# Patient Record
Sex: Male | Born: 1970
Health system: Southern US, Community
[De-identification: ages and names within clinical notes are randomized; demographics above are authoritative.]

## PROBLEM LIST (undated history)

## (undated) DIAGNOSIS — I10 Essential (primary) hypertension: Secondary | ICD-10-CM

## (undated) DIAGNOSIS — E119 Type 2 diabetes mellitus without complications: Secondary | ICD-10-CM

## (undated) DIAGNOSIS — E785 Hyperlipidemia, unspecified: Secondary | ICD-10-CM

## (undated) HISTORY — DX: Hyperlipidemia, unspecified: E78.5

---

## 2007-01-17 ENCOUNTER — Emergency Department (HOSPITAL_COMMUNITY): Admission: EM | Admit: 2007-01-17 | Discharge: 2007-01-17 | Payer: Self-pay | Admitting: Emergency Medicine

## 2015-05-14 ENCOUNTER — Ambulatory Visit (INDEPENDENT_AMBULATORY_CARE_PROVIDER_SITE_OTHER): Payer: Self-pay | Admitting: Family Medicine

## 2015-05-14 VITALS — BP 140/90 | HR 67 | Temp 98.5°F | Resp 16 | Ht 71.0 in | Wt 189.0 lb

## 2015-05-14 DIAGNOSIS — Z021 Encounter for pre-employment examination: Secondary | ICD-10-CM

## 2015-05-14 DIAGNOSIS — Z024 Encounter for examination for driving license: Secondary | ICD-10-CM

## 2015-05-14 NOTE — Progress Notes (Signed)
Airline pilot Medical Examination   Dominic Hill is a 44 y.o. male who presents today for a commercial driver fitness determination physical exam. The patient reports no problems. The following portions of the patient's history were reviewed and updated as appropriate: allergies, current medications, past family history, past medical history, past social history, past surgical history and problem list. Review of Systems A comprehensive review of systems was negative.   Objective:    Vision:  Visual Acuity Screening   Right eye Left eye Both eyes  Without correction: With correction:     Comments: Peripheral Vision: Right eye 85 degrees. Left eye 85 degrees.    Applicant can recognize and distinguish among traffic control signals and devices showing standard red, green, and amber colors.     Monocular Vision?: No   Hearing:   Hearing Screening Comments: Whisper Test Passed at 10 ft      BP 148/90 mmHg  Pulse 67  Temp(Src) 98.5 F (36.9 C) (Oral)  Resp 16  Ht  (1.803 m)  Wt 189 lb (85.73 kg)  BMI 26.37 kg/m2  SpO2 99%  General Appearance:    Alert, cooperative, no distress, appears stated age  Head:    Normocephalic, without obvious abnormality, atraumatic  Eyes:    PERRL, conjunctiva/corneas clear, EOM's intact, fundi    benign, both eyes       Ears:    Normal TM's and external ear canals, both ears  Nose:   Nares normal, septum midline, mucosa normal, no drainage    or sinus tenderness  Throat:   Lips, mucosa, and tongue normal; teeth and gums normal  Neck:   Supple, symmetrical, trachea midline, no adenopathy;       thyroid:  No enlargement/tenderness/nodules; no carotid   bruit or JVD  Back:     Symmetric, no curvature, ROM normal, no CVA tenderness  Lungs:     Clear to auscultation bilaterally, respirations unlabored  Chest wall:    No tenderness or deformity  Heart:    Regular rate and rhythm, S1 and S2 normal, no murmur,  rub   or gallop  Abdomen:     Soft, non-tender, bowel sounds active all four quadrants,    no masses, no organomegaly  Genitalia:  No inguinal hernias  Rectal:    Extremities:   Extremities normal, atraumatic, no cyanosis or edema  Pulses:   2+ and symmetric all extremities  Skin:   Skin color, texture, turgor normal, no rashes or lesions  Lymph nodes:   Cervical, supraclavicular, and axillary nodes normal  Neurologic:   CNII-XII intact. Normal strength, sensation and reflexes      throughout    Labs: No results found for: SPECGRAV, PROTEINUR, BILIRUBINUR, GLUCOSEU    Assessment:    Healthy male exam.  Meets standards in 23 CFR 391.41;  qualifies for 2 year certificate.    Plan:    Medical examiners certificate completed and printed. Return as needed.   Elevated BP - borderline but technically ok - pt admits stressed out in office and not a very healthy diet. Reviewed dec salt and increase water. Check bp outside office every 1-2 mos and see physician to start on BP med prior to next DOT exam if BP continues to by 140/90 or higher.

## 2017-03-28 ENCOUNTER — Encounter: Payer: Self-pay | Admitting: Physician Assistant

## 2017-03-28 ENCOUNTER — Ambulatory Visit (INDEPENDENT_AMBULATORY_CARE_PROVIDER_SITE_OTHER): Payer: Self-pay | Admitting: Physician Assistant

## 2017-03-28 VITALS — BP 143/85 | HR 70 | Temp 98.9°F | Resp 17 | Ht 71.0 in | Wt 201.4 lb

## 2017-03-28 DIAGNOSIS — Z024 Encounter for examination for driving license: Secondary | ICD-10-CM

## 2017-03-28 DIAGNOSIS — R030 Elevated blood-pressure reading, without diagnosis of hypertension: Secondary | ICD-10-CM | POA: Insufficient documentation

## 2017-03-28 NOTE — Patient Instructions (Addendum)
Your blood pressure is elevated. Please establish with a primary care provider, here if you like, for additional evaluation and possible treatment. Your DOT card is good for ONE year due to the blood pressure elevation. It MUST be less than 140/90 next time to qualify.    IF you received an x-ray today, you will receive an invoice from [facility]. Please contact [facility] at [phone] with questions or concerns regarding your invoice.   IF you received labwork today, you will receive an invoice from [facility]. Please contact [facility] at [phone] with questions or concerns regarding your invoice.   Our billing staff will not be able to assist you with questions regarding bills from these companies.  You will be contacted with the lab results as soon as they are available. The fastest way to get your results is to activate your My Chart account. Instructions are located on the last page of this paperwork. If you have not heard from us regarding the results in 2 weeks, please contact this office.

## 2017-03-28 NOTE — Progress Notes (Signed)
This patient presents for DOT examination for fitness for duty.   Medical History:  1. Head/Brain Injuries, disorders or illnesses no  2. Seizures, epilepsy no  3. Eye disorders or impaired vision (except corrective lenses) no  4. Ear disorders, loss of hearing or balance no  5. Heart disease or heart attack, other cardiovascular condition no  6. Heart surgery (valve replacement/bypass, angioplasty, pacemaker/defribrillator) no  7. High blood pressure no  8. High cholesterol no  9. Chronic cough, shortness of breath or other breathing problems no  10. Lung disease (emphysema, asthma or chronic bronchitis) no  11. Kidney disease, dialysis no  12. Digestive problems  no  13. Diabetes or elevated blood sugar no                      If yes to #13, Insulin use no  14. Nervious or psychiatric disorders, e.g., severe depression no  15. Fainting or syncope no  16. Dizziness, headaches, numbness, tingling or memory loss no  17. Unexplained weight loss no  18. Stroke, TIA or paralysis no  19. Missing or impaired hand, arm, foot, leg, finger, toe no  20. Spinal injury or disease no  21. Bone, muscles or nerve problems no  22. Blood clots or bleeding bleeding disorders no  23. Cancer no  24. Chronic infection or other chronic diseases no  25. Sleep disorders, pauses in breathing while asleep, daytime sleepiness, loud snoring no  26. Have you ever had a sleep test? no  27.  Have you ever spent a night in the hospital? no  28. Have you ever had a broken bone? no  29. Have you or or do you use tobacco products? no  30. Regular, frequent alcohol use no  31. Illegal substance use within the past 2 years no  32.  Have you ever failed a drug test or been dependent on an illegal substance? no   Current Medications: Prior to Admission medications   Not on File    Medical Examiner's Comments on Health History:  Chart reviewed. At his last DOT exam (05/14/2015), BP was 148/90, and he  was advised to establish with a primary care provider for evaluation and anticipated treatment for mild HTN. He received a 2 year card. This is his first visit back in this health system.  TESTING:   Visual Acuity Screening   Right eye Left eye Both eyes  Without correction: 20/25 20/25 20/20   With correction:       Monocular Vision: No.  Hearing Aid used for test: No. Hearing Aid required to to meet standard: No.  BP (!) 182/99   Pulse 70   Temp 98.9 F (37.2 C) (Oral)   Resp 17   Ht 5\' 11"  (1.803 m)   Wt 201 lb 6 oz (91.3 kg)   SpO2 97%   BMI 28.09 kg/m  Pulse rate is regular     PHYSICAL EXAMINATION:  General Appearance Not markedly obese. No tremor, signs of alcoholism, problem drinking or drug abuse.   Skin Warm, dry and intact. Scars (bilateral knees, consistent with traumatic injuries, well healed), Tattoos (none)  Eyes Pupils are equal, round and reactive to light and accommodation, extraocular movements are intact. No exophthalmos, no nystagmus. Evidence of cataracts, retinopathy, macular degeneration, aphakia, glaucoma may need referral to a specialist.  Ears Normal external ears. External canal without occlusion. No scarring of the TM. No perforation of the TM.  Mouth and Throat  Clear and moist. No irremedial deformities likely to interfere with breathing or swallowing.  Heart No murmurs, extra sounds, evidence of cardiomegaly. No pacemaker. No implantable defibrillator.  Lungs and Chest (excluding breasts) Normal chest expansion, respiratory rate, breath sounds. No cyanosis.  Abdomen and Viscera No liver enlargement. No splenic enlargement. No masses, bruits, hernias or significant abdominal wall weakness.  Genitourinary  No inguinal or femoral hernia.  Spine and other musculoskeletal No tenderness, no limitation of motion, no deformities. No evidence of previous surgery.  Extremities No loss or impairment of leg, foot, toe, arm, hand, finger. No perceptible limp,  deformities, atrophy, weakness, paralysis, clubbing, edema, hypotonia. Patient has sufficient grasp and prehension to maintain steering wheel grip. Patient has sufficient mobility and strength in the lower limbs to operate pedals properly.  Neurologic Normal equilibrium, coordination, speech pattern. No paresthesia, asymmetry of deep tendon reflexes, sensory or positional abnormalities. No abnormality of patellar or Babinski's reflexes.  Gait Not antalgic or ataxic  Vascular Normal pulses. No carotid or arterial bruits. No varicose veins.    Meets standards, but periodic monitoring required due to: elevated blood pressure  Driver qualified only for: 1 year   Wearing corrective lenses: no Wearing hearing aid: no Accompanied by a N/A waiver/exemption Skill performance Evaluation (SPE) Certificate: no Driving within an exempt intracity zone: no Qualified by operation of 49 CFR 391.64: no  Certification expires 03/28/2017

## 2018-03-23 ENCOUNTER — Ambulatory Visit: Payer: BLUE CROSS/BLUE SHIELD | Admitting: Physician Assistant

## 2018-03-23 ENCOUNTER — Other Ambulatory Visit: Payer: Self-pay

## 2018-03-23 ENCOUNTER — Encounter: Payer: Self-pay | Admitting: Physician Assistant

## 2018-03-23 VITALS — BP 150/84 | HR 75 | Temp 99.3°F | Resp 18 | Ht 71.0 in | Wt 205.0 lb

## 2018-03-23 DIAGNOSIS — I10 Essential (primary) hypertension: Secondary | ICD-10-CM | POA: Diagnosis not present

## 2018-03-23 DIAGNOSIS — E119 Type 2 diabetes mellitus without complications: Secondary | ICD-10-CM

## 2018-03-23 DIAGNOSIS — R9431 Abnormal electrocardiogram [ECG] [EKG]: Secondary | ICD-10-CM | POA: Diagnosis not present

## 2018-03-23 MED ORDER — AMLODIPINE BESYLATE 10 MG PO TABS: 10.0000 mg | ORAL_TABLET | Freq: Every day | ORAL | 3 refills | Status: DC

## 2018-03-23 NOTE — Progress Notes (Signed)
Dominic Hill  MRN: [id number] DOB: 1970-11-06  PCP: Mancel Bale, PA-C  Chief Complaint  Patient presents with  . Hypertension    Subjective:  Pt presents to clinic for evaluation of his BP.  He had a DOT and he was only given a 3 month card due to elevated BP. He does not check his BP at home.  Last DOT card was only for a year due to elevated BP.  History is obtained by patient.  Review of Systems  Constitutional: Negative for chills and fever.  Eyes: Negative for visual disturbance.  Respiratory: Negative for cough and shortness of breath.   Cardiovascular: Negative for chest pain, palpitations and leg swelling.  Neurological: Negative for dizziness, light-headedness and headaches.    Patient Active Problem List   Diagnosis Date Noted  . Elevated blood pressure reading 03/28/2017    No current outpatient medications on file prior to visit.   No current facility-administered medications on file prior to visit.     No Known Allergies  History reviewed. No pertinent past medical history. Social History   Social History Narrative   Lives with wife,   From [place] - came to [place] in 2000   Social History   Tobacco Use  . Smoking status: Never Smoker  . Smokeless tobacco: Never Used  Substance Use Topics  . Alcohol use: No    Alcohol/week: 0.0 oz  . Drug use: No   family history includes Hypertension in his sister.     Objective:  BP (!) 150/84   Pulse 75   Temp 99.3 F (37.4 C) (Oral)   Resp 18   Ht '5\' 11"'  (1.803 m)   Wt 205 lb (93 kg)   SpO2 97%   BMI 28.59 kg/m  Body mass index is 28.59 kg/m.  Wt Readings from Last 3 Encounters:  03/23/18 205 lb (93 kg)  03/28/17 201 lb 6 oz (91.3 kg)  05/14/15 189 lb (85.7 kg)    Physical Exam  Constitutional: He is oriented to person, place, and time. He appears well-developed and well-nourished.  HENT:  Head: Normocephalic and atraumatic.  Right Ear: External ear normal.  Left Ear: External ear  normal.  Eyes: Conjunctivae are normal.  Neck: Normal range of motion.  Cardiovascular: Normal rate, regular rhythm, normal heart sounds and intact distal pulses.  Pulmonary/Chest: Effort normal and breath sounds normal. He has no wheezes.  Musculoskeletal:       Right lower leg: He exhibits no edema.       Left lower leg: He exhibits no edema.  Neurological: He is alert and oriented to person, place, and time.  Skin: Skin is warm and dry.  Psychiatric: Judgment normal.  Vitals reviewed.  Rhythm: sinus rhythm at a rate of 64. Findings: RBBB Last EKG: none  Changes from last EKG: n/a I have personally reviewed the EKG tracing and agree with the computerized printout.  Assessment and Plan :  Essential hypertension - Plan: EKG 12-Lead, CMP14+EGFR, TSH, amLODipine (NORVASC) 10 MG tablet, Ambulatory referral to Cardiology  Abnormal EKG - Plan: Ambulatory referral to Cardiology -  check labs start medications recheck in 4 weeks. Refer to cards due to abnl ekg - likely will need echo  D/w [place]  Patient verbalized to me that they understand the following: diagnosis, what is being done for them, what to expect and what should be done at home.  Their questions have been answered.  See after visit summary for patient specific instructions.  Windell Hummingbird PA-C  [facility] 03/23/2018 5:18 PM  Please note: Portions of this report may have been transcribed using dragon voice recognition software. Every effort was made to ensure accuracy; however, inadvertent computerized transcription errors may be present.

## 2018-03-23 NOTE — Patient Instructions (Addendum)
Start norvasc to treat your high blood pressure  IF you received an x-ray today, you will receive an invoice from [facility]. Please contact [facility] at [phone] with questions or concerns regarding your invoice.   IF you received labwork today, you will receive an invoice from [facility]. Please contact [facility] at [phone] with questions or concerns regarding your invoice.   Our billing staff will not be able to assist you with questions regarding bills from these companies.  You will be contacted with the lab results as soon as they are available. The fastest way to get your results is to activate your My Chart account. Instructions are located on the last page of this paperwork. If you have not heard from us regarding the results in 2 weeks, please contact this office.     DASH Eating Plan DASH stands for "Dietary Approaches to Stop Hypertension." The DASH eating plan is a healthy eating plan that has been shown to reduce high blood pressure (hypertension). It may also reduce your risk for type 2 diabetes, heart disease, and stroke. The DASH eating plan may also help with weight loss. What are tips for following this plan? General guidelines  Avoid eating more than 2,300 mg (milligrams) of salt (sodium) a day. If you have hypertension, you may need to reduce your sodium intake to 1,500 mg a day.  Limit alcohol intake to no more than 1 drink a day for nonpregnant women and 2 drinks a day for men. One drink equals 12 oz of beer, 5 oz of wine, or 1 oz of hard liquor.  Work with your health care provider to maintain a healthy body weight or to lose weight. Ask what an ideal weight is for you.  Get at least 30 minutes of exercise that causes your heart to beat faster (aerobic exercise) most days of the week. Activities may include walking, swimming, or biking.  Work with your health care provider or diet and nutrition specialist (dietitian) to adjust your  eating plan to your individual calorie needs. Reading food labels  Check food labels for the amount of sodium per serving. Choose foods with less than 5 percent of the Daily Value of sodium. Generally, foods with less than 300 mg of sodium per serving fit into this eating plan.  To find whole grains, look for the word "whole" as the first word in the ingredient list. Shopping  Buy products labeled as "low-sodium" or "no salt added."  Buy fresh foods. Avoid canned foods and premade or frozen meals. Cooking  Avoid adding salt when cooking. Use salt-free seasonings or herbs instead of table salt or sea salt. Check with your health care provider or pharmacist before using salt substitutes.  Do not fry foods. Cook foods using healthy methods such as baking, boiling, grilling, and broiling instead.  Cook with heart-healthy oils, such as olive, canola, soybean, or sunflower oil. Meal planning   Eat a balanced diet that includes: ? 5 or more servings of fruits and vegetables each day. At each meal, try to fill half of your plate with fruits and vegetables. ? Up to 6-8 servings of whole grains each day. ? Less than 6 oz of lean meat, poultry, or fish each day. A 3-oz serving of meat is about the same size as a deck of cards. One egg equals 1 oz. ? 2 servings of low-fat dairy each day. ? A serving of nuts, seeds, or beans 5 times each week. ? Heart-healthy fats. Healthy  fats called Omega-3 fatty acids are found in foods such as flaxseeds and coldwater fish, like sardines, salmon, and mackerel.  Limit how much you eat of the following: ? Canned or prepackaged foods. ? Food that is high in trans fat, such as fried foods. ? Food that is high in saturated fat, such as fatty meat. ? Sweets, desserts, sugary drinks, and other foods with added sugar. ? Full-fat dairy products.  Do not salt foods before eating.  Try to eat at least 2 vegetarian meals each week.  Eat more home-cooked food and  less restaurant, buffet, and fast food.  When eating at a restaurant, ask that your food be prepared with less salt or no salt, if possible. What foods are recommended? The items listed may not be a complete list. Talk with your dietitian about what dietary choices are best for you. Grains Whole-grain or whole-wheat bread. Whole-grain or whole-wheat pasta. Brown rice. Orpah Cobb. Bulgur. Whole-grain and low-sodium cereals. Pita bread. Low-fat, low-sodium crackers. Whole-wheat flour tortillas. Vegetables Fresh or frozen vegetables (raw, steamed, roasted, or grilled). Low-sodium or reduced-sodium tomato and vegetable juice. Low-sodium or reduced-sodium tomato sauce and tomato paste. Low-sodium or reduced-sodium canned vegetables. Fruits All fresh, dried, or frozen fruit. Canned fruit in natural juice (without added sugar). Meat and other protein foods Skinless chicken or [place]. Ground chicken or [place]. Pork with fat trimmed off. Fish and seafood. Egg whites. Dried beans, peas, or lentils. Unsalted nuts, nut butters, and seeds. Unsalted canned beans. Lean cuts of beef with fat trimmed off. Low-sodium, lean deli meat. Dairy Low-fat (1%) or fat-free (skim) milk. Fat-free, low-fat, or reduced-fat cheeses. Nonfat, low-sodium ricotta or cottage cheese. Low-fat or nonfat yogurt. Low-fat, low-sodium cheese. Fats and oils Soft margarine without trans fats. Vegetable oil. Low-fat, reduced-fat, or light mayonnaise and salad dressings (reduced-sodium). Canola, safflower, olive, soybean, and sunflower oils. Avocado. Seasoning and other foods Herbs. Spices. Seasoning mixes without salt. Unsalted popcorn and pretzels. Fat-free sweets. What foods are not recommended? The items listed may not be a complete list. Talk with your dietitian about what dietary choices are best for you. Grains Baked goods made with fat, such as croissants, muffins, or some breads. Dry pasta or rice meal  packs. Vegetables Creamed or fried vegetables. Vegetables in a cheese sauce. Regular canned vegetables (not low-sodium or reduced-sodium). Regular canned tomato sauce and paste (not low-sodium or reduced-sodium). Regular tomato and vegetable juice (not low-sodium or reduced-sodium). Rosita Fire. Olives. Fruits Canned fruit in a light or heavy syrup. Fried fruit. Fruit in cream or butter sauce. Meat and other protein foods Fatty cuts of meat. Ribs. Fried meat. Tomasa Blase. Sausage. Bologna and other processed lunch meats. Salami. Fatback. Hotdogs. Bratwurst. Salted nuts and seeds. Canned beans with added salt. Canned or smoked fish. Whole eggs or egg yolks. Chicken or [place] with skin. Dairy Whole or 2% milk, cream, and half-and-half. Whole or full-fat cream cheese. Whole-fat or sweetened yogurt. Full-fat cheese. Nondairy creamers. Whipped toppings. Processed cheese and cheese spreads. Fats and oils Butter. Stick margarine. Lard. Shortening. Ghee. Bacon fat. Tropical oils, such as coconut, palm kernel, or palm oil. Seasoning and other foods Salted popcorn and pretzels. Onion salt, garlic salt, seasoned salt, table salt, and sea salt. Worcestershire sauce. Tartar sauce. Barbecue sauce. Teriyaki sauce. Soy sauce, including reduced-sodium. Steak sauce. Canned and packaged gravies. Fish sauce. Oyster sauce. Cocktail sauce. Horseradish that you find on the shelf. Ketchup. Mustard. Meat flavorings and tenderizers. Bouillon cubes. Hot sauce and Tabasco sauce. Premade or packaged  marinades. Premade or packaged taco seasonings. Relishes. Regular salad dressings. Where to find more information:  National Heart, Lung, and Blood Institute: PopSteam.is  American Heart Association: www.heart.org Summary  The DASH eating plan is a healthy eating plan that has been shown to reduce high blood pressure (hypertension). It may also reduce your risk for type 2 diabetes, heart disease, and stroke.  With the DASH eating  plan, you should limit salt (sodium) intake to 2,300 mg a day. If you have hypertension, you may need to reduce your sodium intake to 1,500 mg a day.  When on the DASH eating plan, aim to eat more fresh fruits and vegetables, whole grains, lean proteins, low-fat dairy, and heart-healthy fats.  Work with your health care provider or diet and nutrition specialist (dietitian) to adjust your eating plan to your individual calorie needs. This information is not intended to replace advice given to you by your health care provider. Make sure you discuss any questions you have with your health care provider. Document Released: 08/07/2011 Document Revised: 08/11/2016 Document Reviewed: 08/11/2016 Elsevier Interactive Patient Education  [employer].

## 2018-03-24 LAB — CMP14+EGFR
ALK PHOS: 47 IU/L (ref 39–117)
ALT: 30 IU/L (ref 0–44)
AST: 23 IU/L (ref 0–40)
Albumin/Globulin Ratio: 1.7 (ref 1.2–2.2)
Albumin: 4.5 g/dL (ref 3.5–5.5)
BUN/Creatinine Ratio: 14 (ref 9–20)
BUN: 15 mg/dL (ref 6–24)
Bilirubin Total: 0.8 mg/dL (ref 0.0–1.2)
CALCIUM: 9.9 mg/dL (ref 8.7–10.2)
CO2: 24 mmol/L (ref 20–29)
CREATININE: 1.06 mg/dL (ref 0.76–1.27)
Chloride: 104 mmol/L (ref 96–106)
GFR calc Af Amer: 97 mL/min/{1.73_m2} (ref >59)
GFR, EST NON AFRICAN AMERICAN: 84 mL/min/{1.73_m2} (ref >59)
GLOBULIN, TOTAL: 2.6 g/dL (ref 1.5–4.5)
GLUCOSE: 131 mg/dL — AB (ref 65–99)
Potassium: 4.3 mmol/L (ref 3.5–5.2)
Sodium: 144 mmol/L (ref 134–144)
Total Protein: 7.1 g/dL (ref 6.0–8.5)

## 2018-03-24 LAB — TSH: TSH: 0.916 u[IU]/mL (ref 0.450–4.500)

## 2018-03-25 ENCOUNTER — Encounter: Payer: Self-pay | Admitting: Radiology

## 2018-03-26 LAB — HEMOGLOBIN A1C
Est. average glucose Bld gHb Est-mCnc: 140 mg/dL
Hgb A1c MFr Bld: 6.5 % — ABNORMAL HIGH (ref 4.8–5.6)

## 2018-03-26 NOTE — Addendum Note (Signed)
Addended by: Rogelia RohrerMCADOO, Kongmeng Santoro K on: 03/26/2018 08:31 AM   Modules accepted: Orders

## 2018-03-29 DIAGNOSIS — E119 Type 2 diabetes mellitus without complications: Secondary | ICD-10-CM | POA: Insufficient documentation

## 2018-03-29 DIAGNOSIS — E1110 Type 2 diabetes mellitus with ketoacidosis without coma: Secondary | ICD-10-CM | POA: Insufficient documentation

## 2018-03-29 DIAGNOSIS — I10 Essential (primary) hypertension: Secondary | ICD-10-CM | POA: Insufficient documentation

## 2018-03-29 MED ORDER — METFORMIN HCL ER 500 MG PO TB24
500.0000 mg | ORAL_TABLET | Freq: Every day | ORAL | 2 refills | Status: DC
Start: 2018-03-29 — End: 2019-10-14

## 2018-03-29 NOTE — Addendum Note (Signed)
Addended by: Morrell RiddleWEBER, Renay Crammer L on: 03/29/2018 07:51 AM   Modules accepted: Orders

## 2018-04-24 ENCOUNTER — Other Ambulatory Visit: Payer: Self-pay | Admitting: Physician Assistant

## 2018-04-24 ENCOUNTER — Ambulatory Visit: Payer: BLUE CROSS/BLUE SHIELD | Admitting: Physician Assistant

## 2018-04-24 ENCOUNTER — Encounter: Payer: Self-pay | Admitting: Physician Assistant

## 2018-04-24 ENCOUNTER — Other Ambulatory Visit: Payer: Self-pay

## 2018-04-24 VITALS — BP 146/70 | HR 88 | Temp 98.2°F | Ht 72.0 in | Wt 204.6 lb

## 2018-04-24 DIAGNOSIS — E119 Type 2 diabetes mellitus without complications: Secondary | ICD-10-CM

## 2018-04-24 DIAGNOSIS — I10 Essential (primary) hypertension: Secondary | ICD-10-CM

## 2018-04-24 MED ORDER — AMLODIPINE BESYLATE 10 MG PO TABS: 10.0000 mg | ORAL_TABLET | Freq: Every day | ORAL | 3 refills | Status: DC

## 2018-04-24 NOTE — Patient Instructions (Addendum)
° ° ° °  If you have lab work done today you will be contacted with your lab results within the next 2 weeks.  If you have not heard from us then please contact us. The fastest way to get your results is to register for My Chart. ° ° °IF you received an x-ray today, you will receive an invoice from [facility]. Please contact [facility] at [phone] with questions or concerns regarding your invoice.  ° °IF you received labwork today, you will receive an invoice from [facility]. Please contact [facility] at [phone] with questions or concerns regarding your invoice.  ° °Our billing staff will not be able to assist you with questions regarding bills from these companies. ° °You will be contacted with the lab results as soon as they are available. The fastest way to get your results is to activate your My Chart account. Instructions are located on the last page of this paperwork. If you have not heard from us regarding the results in 2 weeks, please contact this office. °  ° ° ° °

## 2018-04-24 NOTE — Progress Notes (Signed)
04/24/2018 10:58 AM   DOB: [birth year] / MRN: [id number]  SUBJECTIVE:  Dominic Hill is a 47 y.o. male presenting for HTN recheck. He has not had his BP medication in three days.  He is taking his metformin.  He feels well and denies complaints today.  He has No Known Allergies.   He  has no past medical history on file.    He  reports that he has never smoked. He has never used smokeless tobacco. He reports that he does not drink alcohol or use drugs. He  reports that he currently engages in sexual activity and has had partner(s) who are Male. The patient  has no past surgical history on file.  His family history includes Hypertension in his sister.  Review of Systems  Constitutional: Negative for chills, diaphoresis and fever.  Respiratory: Negative for cough, hemoptysis, sputum production, shortness of breath and wheezing.   Cardiovascular: Negative for chest pain, orthopnea and leg swelling.  Gastrointestinal: Negative for nausea.  Skin: Negative for rash.  Neurological: Negative for dizziness.    The problem list and medications were reviewed and updated by myself where necessary and exist elsewhere in the encounter.   OBJECTIVE:  BP (!) 146/70 (BP Location: Left Arm, Patient Position: Sitting, Cuff Size: Large)   Pulse 88   Temp 98.2 F (36.8 C) (Oral)   Ht 6' (1.829 m)   Wt 204 lb 9.6 oz (92.8 kg)   SpO2 99%   BMI 27.75 kg/m   Wt Readings from Last 3 Encounters:  04/24/18 204 lb 9.6 oz (92.8 kg)  03/23/18 205 lb (93 kg)  03/28/17 201 lb 6 oz (91.3 kg)   Temp Readings from Last 3 Encounters:  04/24/18 98.2 F (36.8 C) (Oral)  03/23/18 99.3 F (37.4 C) (Oral)  03/28/17 98.9 F (37.2 C) (Oral)   BP Readings from Last 3 Encounters:  04/24/18 (!) 146/70  03/23/18 (!) 150/84  03/28/17 (!) 143/85   Pulse Readings from Last 3 Encounters:  04/24/18 88  03/23/18 75  03/28/17 70    Physical Exam  Constitutional: He is oriented to person, place, and time.  He appears well-developed. He does not appear ill.  Eyes: Pupils are equal, round, and reactive to light. Conjunctivae and EOM are normal.  Cardiovascular: Normal rate, regular rhythm, S1 normal, S2 normal, normal heart sounds, intact distal pulses and normal pulses. Exam reveals no gallop and no friction rub.  No murmur heard. Pulmonary/Chest: Effort normal. No stridor. No respiratory distress. He has no wheezes. He has no rales.  Abdominal: He exhibits no distension.  Musculoskeletal: Normal range of motion. He exhibits no edema.  Neurological: He is alert and oriented to person, place, and time. No cranial nerve deficit. Coordination normal.  Skin: Skin is warm and dry. He is not diaphoretic.  Psychiatric: He has a normal mood and affect.  Nursing note and vitals reviewed.   Lab Results  Component Value Date   HGBA1C 6.5 (H) 03/26/2018    No results found for: WBC, HGB, HCT, MCV, PLT  Lab Results  Component Value Date   CREATININE 1.06 03/23/2018   BUN 15 03/23/2018   NA 144 03/23/2018   K 4.3 03/23/2018   CL 104 03/23/2018   CO2 24 03/23/2018    Lab Results  Component Value Date   ALT 30 03/23/2018   AST 23 03/23/2018   ALKPHOS 47 03/23/2018   BILITOT 0.8 03/23/2018    Lab Results  Component Value Date  TSH 0.916 03/23/2018    No results found for: CHOL, HDL, LDLCALC, LDLDIRECT, TRIG, CHOLHDL   ASSESSMENT AND PLAN:  Dominic Hill was seen today for hypertension.  Diagnoses and all orders for this visit:  Essential hypertension: He has not had his norvasc in three days.  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   Controlled type 2 diabetes mellitus without complication, without long-term current use of insulin (HCC) -     Hemoglobin A1c    The patient is advised to call or return to clinic if he does not see an improvement in symptoms, or  to seek the care of the closest emergency department if he worsens with the above plan.   Deliah BostonMichael Keili Hasten, MHS, PA-C Primary Care at [facility] 04/24/2018 10:58 AM

## 2018-04-25 LAB — HEMOGLOBIN A1C
Est. average glucose Bld gHb Est-mCnc: 143 mg/dL
Hgb A1c MFr Bld: 6.6 % — ABNORMAL HIGH (ref 4.8–5.6)

## 2018-04-28 ENCOUNTER — Encounter: Payer: Self-pay | Admitting: Physician Assistant

## 2018-04-28 ENCOUNTER — Other Ambulatory Visit: Payer: Self-pay

## 2018-04-28 ENCOUNTER — Ambulatory Visit: Payer: BLUE CROSS/BLUE SHIELD | Admitting: Physician Assistant

## 2018-04-28 VITALS — BP 136/82 | HR 73 | Temp 98.6°F | Resp 18 | Ht 72.0 in | Wt 203.2 lb

## 2018-04-28 DIAGNOSIS — I10 Essential (primary) hypertension: Secondary | ICD-10-CM

## 2018-04-28 NOTE — Patient Instructions (Signed)
° ° ° °  If you have lab work done today you will be contacted with your lab results within the next 2 weeks.  If you have not heard from us then please contact us. The fastest way to get your results is to register for My Chart. ° ° °IF you received an x-ray today, you will receive an invoice from [facility]. Please contact [facility] at [phone] with questions or concerns regarding your invoice.  ° °IF you received labwork today, you will receive an invoice from [facility]. Please contact [facility] at [phone] with questions or concerns regarding your invoice.  ° °Our billing staff will not be able to assist you with questions regarding bills from these companies. ° °You will be contacted with the lab results as soon as they are available. The fastest way to get your results is to activate your My Chart account. Instructions are located on the last page of this paperwork. If you have not heard from us regarding the results in 2 weeks, please contact this office. °  ° ° ° °

## 2018-04-28 NOTE — Progress Notes (Signed)
04/28/2018 8:36 AM   DOB: 08/23/1971 / MRN: [id number]  SUBJECTIVE:  Dominic Hill is a 47 y.o. 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 He feels well today and denies complaints. Last I saw him he had not had his medication in over three days.   He has No Known Allergies.   He  has no past medical history on file.    He  reports that he has never smoked. He has never used smokeless tobacco. He reports that he does not drink alcohol or use drugs. He  reports that he currently engages in sexual activity and has had partner(s) who are Male. The patient  has no past surgical history on file.  His family history includes Hypertension in his sister.  Review of Systems  Constitutional: Negative for chills, diaphoresis and fever.  Eyes: Negative.   Respiratory: Negative for cough, hemoptysis, sputum production, shortness of breath and wheezing.   Cardiovascular: Negative for chest pain, orthopnea and leg swelling.  Gastrointestinal: Negative for abdominal pain, blood in stool, constipation, diarrhea, heartburn, melena, nausea and vomiting.  Genitourinary: Negative for dysuria, flank pain, frequency, hematuria and urgency.  Skin: Negative for rash.  Neurological: Negative for dizziness, sensory change, speech change, focal weakness and headaches.    The problem list and medications were reviewed and updated by myself where necessary and exist elsewhere in the encounter.   OBJECTIVE:  BP 136/82   Pulse 73   Temp 98.6 F (37 C) (Oral)   Resp 18   Ht 6' (1.829 m)   Wt 203 lb 3.2 oz (92.2 kg)   SpO2 99%   BMI 27.56 kg/m   Wt Readings from Last 3 Encounters:  04/28/18 203 lb 3.2 oz (92.2 kg)  04/24/18 204 lb 9.6 oz (92.8 kg)  03/23/18 205 lb (93 kg)   Temp Readings from Last 3 Encounters:  04/28/18 98.6 F (37 C) (Oral)  04/24/18 98.2 F (36.8 C) (Oral)  03/23/18 99.3 F (37.4 C) (Oral)   BP Readings from Last 3 Encounters:  04/28/18  136/82  04/24/18 (!) 146/70  03/23/18 (!) 150/84   Pulse Readings from Last 3 Encounters:  04/28/18 73  04/24/18 88  03/23/18 75    Physical Exam  Constitutional: He is oriented to person, place, and time. He appears well-developed. He does not appear ill.  Eyes: Pupils are equal, round, and reactive to light. Conjunctivae and EOM are normal.  Cardiovascular: Normal rate, regular rhythm, S1 normal, S2 normal, normal heart sounds, intact distal pulses and normal pulses. Exam reveals no gallop and no friction rub.  No murmur heard. Pulmonary/Chest: Effort normal. No stridor. No respiratory distress. He has no wheezes. He has no rales.  Abdominal: He exhibits no distension.  Musculoskeletal: Normal range of motion. He exhibits no edema.  Neurological: He is alert and oriented to person, place, and time. No cranial nerve deficit. Coordination normal.  Skin: Skin is warm and dry. He is not diaphoretic.  Psychiatric: He has a normal mood and affect.  Nursing note and vitals reviewed.   Lab Results  Component Value Date   HGBA1C 6.6 (H) 04/24/2018    No results found for: WBC, HGB, HCT, MCV, PLT  Lab Results  Component Value Date   CREATININE 1.06 03/23/2018   BUN 15 03/23/2018   NA 144 03/23/2018   K 4.3 03/23/2018   CL 104 03/23/2018   CO2 24 03/23/2018    Lab Results  Component Value Date   ALT 30 03/23/2018   AST 23 03/23/2018   ALKPHOS 47 03/23/2018   BILITOT 0.8 03/23/2018    Lab Results  Component Value Date   TSH 0.916 03/23/2018    No results found for: CHOL, HDL, LDLCALC, LDLDIRECT, TRIG, CHOLHDL   ASSESSMENT AND PLAN:  Dominic Hill was seen today for hypertension.  Diagnoses and all orders for this visit:  Hypertension, unspecified type: Pt feels well today.  Compliant with 10 norvasc.  Letter to DOT examiner composed. He will come back in three months for routine follow up of low grade diabetes and well controlled HTN.     The patient is advised to  call or return to clinic if he does not see an improvement in symptoms, or to seek the care of the closest emergency department if he worsens with the above plan.   Deliah Boston, MHS, PA-C Primary Care at [facility] 04/28/2018 8:36 AM

## 2019-10-14 ENCOUNTER — Other Ambulatory Visit: Payer: Self-pay

## 2019-10-14 ENCOUNTER — Ambulatory Visit (INDEPENDENT_AMBULATORY_CARE_PROVIDER_SITE_OTHER): Payer: 59 | Admitting: Adult Health Nurse Practitioner

## 2019-10-14 ENCOUNTER — Encounter: Payer: Self-pay | Admitting: Adult Health Nurse Practitioner

## 2019-10-14 VITALS — BP 167/94 | HR 74 | Temp 98.4°F | Ht 72.0 in | Wt 210.4 lb

## 2019-10-14 DIAGNOSIS — Z23 Encounter for immunization: Secondary | ICD-10-CM

## 2019-10-14 DIAGNOSIS — E119 Type 2 diabetes mellitus without complications: Secondary | ICD-10-CM

## 2019-10-14 DIAGNOSIS — I10 Essential (primary) hypertension: Secondary | ICD-10-CM | POA: Diagnosis not present

## 2019-10-14 MED ORDER — AMLODIPINE BESYLATE 10 MG PO TABS: 10.0000 mg | ORAL_TABLET | Freq: Every day | ORAL | 1 refills | Status: DC

## 2019-10-14 MED ORDER — METFORMIN HCL ER 500 MG PO TB24: 500.0000 mg | ORAL_TABLET | Freq: Every day | ORAL | 1 refills | Status: DC

## 2019-10-14 NOTE — Progress Notes (Signed)
Chief Complaint  Patient presents with  . Establish Care    No concern just need to get under a providers care. he is fasting    HPI   Dominic Hill  Presents to establish care.  He has a hx of Hypertension and controlled Type II DM.  He has backed off some on his medications as his BP has been doing well.  It is elevated today but he has a cuff to check at home.  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    Denies any other problem.  He is fasting today.   Problem List    Problem List: 2019-07: Controlled type 2 diabetes mellitus without complication,  without long-term current use of insulin ([place]) 2019-07: Essential hypertension 2018-07: Elevated blood pressure reading   Allergies   has No Known Allergies.  Medications    Current Outpatient Medications:  .  amLODipine (NORVASC) 10 MG tablet, Take 1 tablet (10 mg total) by mouth daily., Disp: 90 tablet, Rfl: 3 .  metFORMIN (GLUCOPHAGE XR) 500 MG 24 hr tablet, Take 1 tablet (500 mg total) by mouth daily with breakfast., Disp: 30 tablet, Rfl: 2   Review of Systems    Constitutional: Negative for activity change, appetite change, chills and fever.  HENT: Negative for congestion, nosebleeds, trouble swallowing and voice change.   Respiratory: Negative for cough, shortness of breath and wheezing.   Cardiac:  Negative for chest pain, pressure, syncope  Gastrointestinal: Negative for diarrhea, nausea and vomiting.  Genitourinary: Negative for difficulty urinating, dysuria, flank pain and hematuria.  Musculoskeletal: Negative for back pain, joint swelling and neck pain.  Neurological: Negative for dizziness, speech difficulty, light-headedness and numbness.  See HPI. All other review of systems negative.     Physical Exam:    height is 6' (1.829 m) and weight is 210 lb 6.4 oz (95.4 kg). His temporal temperature is 98.4 F (36.9 C). His blood pressure is 167/94 (abnormal) and his pulse is 74. His oxygen  saturation is 98%.   Physical Examination: General appearance - alert, well appearing, and in no distress and oriented to person, place, and time Mental status - normal mood, behavior, speech, dress, motor activity, and thought processes Eyes - PERRL. Extraocular movements intact.  No nystagmus.  Neck - supple, no significant adenopathy, carotids upstroke normal bilaterally, no bruits, thyroid exam: thyroid is normal in size without nodules or tenderness Chest - clear to auscultation, no wheezes, rales or rhonchi, symmetric air entry  Heart - normal rate, regular rhythm, normal S1, S2, no murmurs, rubs, clicks or gallops Extremities - dependent LE edema without clubbing or cyanosis Skin - normal coloration and turgor, no rashes, no suspicious skin lesions noted  No hyperpigmentation of skin.  No current hematomas noted   Lab /Imaging Review    orders written for new lab studies as appropriate; see orders, no lab studies available for review at time of visit.   Assessment & Plan:  Dominic Hill is a 49 y.o. male    1. Need for prophylactic vaccination and inoculation against influenza   2. Hypertension, unspecified type   3. Controlled type 2 diabetes mellitus without complication, without long-term current use of insulin ([place])   4. Essential hypertension    Orders Placed This Encounter  Procedures  . Flu Vaccine QUAD 36+ mos IM  . CMP14+EGFR  . TSH  . Lipid panel  . Hemoglobin A1c    Orders Placed This Encounter  Procedures  .  Flu Vaccine QUAD 36+ mos IM   No orders of the defined types were placed in this encounter.  Meds ordered this encounter  Medications  . metFORMIN (GLUCOPHAGE XR) 500 MG 24 hr tablet    Sig: Take 1 tablet (500 mg total) by mouth daily with breakfast.    Dispense:  90 tablet    Refill:  1  . amLODipine (NORVASC) 10 MG tablet    Sig: Take 1 tablet (10 mg total) by mouth daily.    Dispense:  90 tablet    Refill:  1   Will f/u pending lab  results.  He is inline with this plan.   Glyn Ade, NP

## 2019-10-14 NOTE — Patient Instructions (Signed)
° ° ° °  If you have lab work done today you will be contacted with your lab results within the next 2 weeks.  If you have not heard from us then please contact us. The fastest way to get your results is to register for My Chart. ° ° °IF you received an x-ray today, you will receive an invoice from [facility]. Please contact [facility] at [phone] with questions or concerns regarding your invoice.  ° °IF you received labwork today, you will receive an invoice from [facility]. Please contact [facility] at [phone] with questions or concerns regarding your invoice.  ° °Our billing staff will not be able to assist you with questions regarding bills from these companies. ° °You will be contacted with the lab results as soon as they are available. The fastest way to get your results is to activate your My Chart account. Instructions are located on the last page of this paperwork. If you have not heard from us regarding the results in 2 weeks, please contact this office. °  ° ° ° °

## 2019-10-15 LAB — CMP14+EGFR
ALT: 21 IU/L (ref 0–44)
AST: 17 IU/L (ref 0–40)
Albumin/Globulin Ratio: 1.5 (ref 1.2–2.2)
Albumin: 4.4 g/dL (ref 4.0–5.0)
Alkaline Phosphatase: 63 IU/L (ref 39–117)
BUN/Creatinine Ratio: 15 (ref 9–20)
BUN: 17 mg/dL (ref 6–24)
Bilirubin Total: 0.7 mg/dL (ref 0.0–1.2)
CO2: 23 mmol/L (ref 20–29)
Calcium: 9.9 mg/dL (ref 8.7–10.2)
Chloride: 101 mmol/L (ref 96–106)
Creatinine, Ser: 1.13 mg/dL (ref 0.76–1.27)
GFR calc Af Amer: 89 mL/min/{1.73_m2} (ref >59)
GFR calc non Af Amer: 77 mL/min/{1.73_m2} (ref >59)
Globulin, Total: 2.9 g/dL (ref 1.5–4.5)
Glucose: 99 mg/dL (ref 65–99)
Potassium: 3.9 mmol/L (ref 3.5–5.2)
Sodium: 139 mmol/L (ref 134–144)
Total Protein: 7.3 g/dL (ref 6.0–8.5)

## 2019-10-15 LAB — LIPID PANEL
Chol/HDL Ratio: 4.5 ratio (ref 0.0–5.0)
Cholesterol, Total: 185 mg/dL (ref 100–199)
HDL: 41 mg/dL (ref >39)
LDL Chol Calc (NIH): 92 mg/dL (ref 0–99)
Triglycerides: 312 mg/dL — ABNORMAL HIGH (ref 0–149)
VLDL Cholesterol Cal: 52 mg/dL — ABNORMAL HIGH (ref 5–40)

## 2019-10-15 LAB — TSH: TSH: 1.61 u[IU]/mL (ref 0.450–4.500)

## 2019-10-15 LAB — HEMOGLOBIN A1C
Est. average glucose Bld gHb Est-mCnc: 169 mg/dL
Hgb A1c MFr Bld: 7.5 % — ABNORMAL HIGH (ref 4.8–5.6)

## 2019-11-18 ENCOUNTER — Encounter: Payer: Self-pay | Admitting: Adult Health Nurse Practitioner

## 2019-11-18 NOTE — Progress Notes (Signed)
Labs were slightly abnormal.  Hypertriglyceridemia and elevated A1C.  Will return for discussion of treatment.

## 2019-11-21 NOTE — Progress Notes (Signed)
Pt.notified

## 2019-11-25 ENCOUNTER — Other Ambulatory Visit: Payer: Self-pay

## 2019-11-25 ENCOUNTER — Ambulatory Visit: Payer: 59 | Admitting: Adult Health Nurse Practitioner

## 2019-11-25 ENCOUNTER — Encounter: Payer: Self-pay | Admitting: Adult Health Nurse Practitioner

## 2019-11-25 VITALS — BP 152/80 | HR 80 | Temp 98.7°F | Ht 72.0 in | Wt 206.0 lb

## 2019-11-25 DIAGNOSIS — I10 Essential (primary) hypertension: Secondary | ICD-10-CM | POA: Diagnosis not present

## 2019-11-25 DIAGNOSIS — E119 Type 2 diabetes mellitus without complications: Secondary | ICD-10-CM

## 2019-11-25 MED ORDER — HYDROCHLOROTHIAZIDE 25 MG PO TABS: 25.0000 mg | ORAL_TABLET | Freq: Every day | ORAL | 3 refills | Status: DC

## 2019-11-25 MED ORDER — METFORMIN HCL 500 MG PO TABS: 500.0000 mg | ORAL_TABLET | Freq: Every day | ORAL | 1 refills | Status: DC

## 2019-11-25 NOTE — Progress Notes (Signed)
11/25/2019  Dominic Hill March 03, 1971 [id number]   History   Chief Complaint  Patient presents with  . Diabetes    new onset. f/u, not really take medications     HPI  Patient presents for follow-up.  His A1c has crept up to 7.5 after being 6.5 and 6.1.  In addition, blood pressure is elevated.  Amlodipine is no longer the preferred medication for his insurance.  We discussed going back on antihypertensives and his diabetic medication in the interim while he is improving his diet and exercise.  He is amenable to that.  No past medical history on file. No past surgical history on file. Family History  Problem Relation Age of Onset  . Hypertension Sister    Social History   Tobacco Use  . Smoking status: Never Smoker  . Smokeless tobacco: Never Used  Substance Use Topics  . Alcohol use: No    Alcohol/week: 0.0 standard drinks  . Drug use: No    Review of Systems   Review of Systems  Constitutional: Negative for activity change, appetite change, chills and fever.  HENT: Negative for congestion, nosebleeds, trouble swallowing and voice change.   Respiratory: Negative for cough, shortness of breath and wheezing.   Gastrointestinal: Negative for diarrhea, nausea and vomiting.  Genitourinary: Negative for difficulty urinating, dysuria, flank pain and hematuria.  Musculoskeletal: Negative for back pain, joint swelling and neck pain.  Neurological: Negative for dizziness, speech difficulty, light-headedness and numbness.  See HPI. All other review of systems negative.    Allergies   Patient has no known allergies.  Home Medications    Current Outpatient Medications:  .  hydrochlorothiazide (HYDRODIURIL) 25 MG tablet, Take 1 tablet (25 mg total) by mouth daily., Disp: 30 tablet, Rfl: 3 .  metFORMIN (GLUCOPHAGE) 500 MG tablet, Take 1 tablet (500 mg total) by mouth daily with breakfast., Disp: 30 tablet, Rfl: 1  Meds Ordered and Administered this Visit   Meds ordered  this encounter  Medications  . metFORMIN (GLUCOPHAGE) 500 MG tablet    Sig: Take 1 tablet (500 mg total) by mouth daily with breakfast.    Dispense:  30 tablet    Refill:  1  . hydrochlorothiazide (HYDRODIURIL) 25 MG tablet    Sig: Take 1 tablet (25 mg total) by mouth daily.    Dispense:  30 tablet    Refill:  3    BP (!) 152/80   Pulse 80   Temp 98.7 F (37.1 C) (Temporal)   Ht 6' (1.829 m)   Wt 206 lb (93.4 kg)   SpO2 96%   BMI 27.94 kg/m   Physical Exam General appearance: alert, well appearing, and in no distress, oriented to person, place, and time and anxious. Chest: clear to auscultation, no wheezes, rales or rhonchi, symmetric air entry.  CVS exam: normal rate, regular rhythm, normal S1, S2, no murmurs, rubs, clicks or gallops. Skin exam - normal coloration and turgor, no rashes, no suspicious skin lesions noted. Mental Status: normal mood, behavior, speech, dress, motor activity, and thought processes, anxious.   MDM   1. Controlled type 2 diabetes mellitus without complication, without long-term current use of insulin ([place])   2. Hypertension, unspecified type    Meds ordered this encounter  Medications  . metFORMIN (GLUCOPHAGE) 500 MG tablet    Sig: Take 1 tablet (500 mg total) by mouth daily with breakfast.    Dispense:  30 tablet    Refill:  1  . hydrochlorothiazide (HYDRODIURIL)  25 MG tablet    Sig: Take 1 tablet (25 mg total) by mouth daily.    Dispense:  30 tablet    Refill:  3   Will f/u in 6 weeks to reevaluate.  He is inline with this plan.   Elyse Jarvis, NP

## 2019-11-25 NOTE — Patient Instructions (Addendum)
   Managing Your Hypertension Hypertension is commonly called high blood pressure. This is when the force of your blood pressing against the walls of your arteries is too strong. Arteries are blood vessels that carry blood from your heart throughout your body. Hypertension forces the heart to work harder to pump blood, and may cause the arteries to become narrow or stiff. Having untreated or uncontrolled hypertension can cause heart attack, stroke, kidney disease, and other problems. What are blood pressure readings? A blood pressure reading consists of a higher number over a lower number. Ideally, your blood pressure should be below 120/80. The first ("top") number is called the systolic pressure. It is a measure of the pressure in your arteries as your heart beats. The second ("bottom") number is called the diastolic pressure. It is a measure of the pressure in your arteries as the heart relaxes. What does my blood pressure reading mean? Blood pressure is classified into four stages. Based on your blood pressure reading, your health care provider may use the following stages to determine what type of treatment you need, if any. Systolic pressure and diastolic pressure are measured in a unit called mm Hg. Normal  Systolic pressure: below 120.  Diastolic pressure: below 80. Elevated  Systolic pressure: 120-129.  Diastolic pressure: below 80. Hypertension stage 1  Systolic pressure: 130-139.  Diastolic pressure: 80-89. Hypertension stage 2  Systolic pressure: 140 or above.  Diastolic pressure: 90 or above. What health risks are associated with hypertension? Managing your hypertension is an important responsibility. Uncontrolled hypertension can lead to:  A heart attack.  A stroke.  A weakened blood vessel (aneurysm).  Heart failure.  Kidney damage.  Eye damage.  Metabolic syndrome.  Memory and concentration problems. What changes can I make to manage my  hypertension? Hypertension can be managed by making lifestyle changes and possibly by taking medicines. Your health care provider will help you make a plan to bring your blood pressure within a normal range. Eating and drinking   Eat a diet that is high in fiber and potassium, and low in salt (sodium), added sugar, and fat. An example eating plan is called the DASH (Dietary Approaches to Stop Hypertension) diet. To eat this way: ? Eat plenty of fresh fruits and vegetables. Try to fill half of your plate at each meal with fruits and vegetables. ? Eat whole grains, such as whole wheat pasta, brown rice, or whole grain bread. Fill about one quarter of your plate with whole grains. ? Eat low-fat diary products. ? Avoid fatty cuts of meat, processed or cured meats, and poultry with skin. Fill about one quarter of your plate with lean proteins such as fish, chicken without skin, beans, eggs, and tofu. ? Avoid premade and processed foods. These tend to be higher in sodium, added sugar, and fat.  Reduce your daily sodium intake. Most people with hypertension should eat less than 1,500 mg of sodium a day.  Limit alcohol intake to no more than 1 drink a day for nonpregnant women and 2 drinks a day for men. One drink equals 12 oz of beer, 5 oz of wine, or 1 oz of hard liquor. Lifestyle  Work with your health care provider to maintain a healthy body weight, or to lose weight. Ask what an ideal weight is for you.  Get at least 30 minutes of exercise that causes your heart to beat faster (aerobic exercise) most days of the week. Activities may include walking, swimming, or biking.    Include exercise to strengthen your muscles (resistance exercise), such as weight lifting, as part of your weekly exercise routine. Try to do these types of exercises for 30 minutes at least 3 days a week.  Do not use any products that contain nicotine or tobacco, such as cigarettes and e-cigarettes. If you need help quitting,  ask your health care provider.  Control any long-term (chronic) conditions you have, such as high cholesterol or diabetes. Monitoring  Monitor your blood pressure at home as told by your health care provider. Your personal target blood pressure may vary depending on your medical conditions, your age, and other factors.  Have your blood pressure checked regularly, as often as told by your health care provider. Working with your health care provider  Review all the medicines you take with your health care provider because there may be side effects or interactions.  Talk with your health care provider about your diet, exercise habits, and other lifestyle factors that may be contributing to hypertension.  Visit your health care provider regularly. Your health care provider can help you create and adjust your plan for managing hypertension. Will I need medicine to control my blood pressure? Your health care provider may prescribe medicine if lifestyle changes are not enough to get your blood pressure under control, and if:  Your systolic blood pressure is 130 or higher.  Your diastolic blood pressure is 80 or higher. Take medicines only as told by your health care provider. Follow the directions carefully. Blood pressure medicines must be taken as prescribed. The medicine does not work as well when you skip doses. Skipping doses also puts you at risk for problems. Contact a health care provider if:  You think you are having a reaction to medicines you have taken.  You have repeated (recurrent) headaches.  You feel dizzy.  You have swelling in your ankles.  You have trouble with your vision. Get help right away if:  You develop a severe headache or confusion.  You have unusual weakness or numbness, or you feel faint.  You have severe pain in your chest or abdomen.  You vomit repeatedly.  You have trouble breathing. Summary  Hypertension is when the force of blood pumping  through your arteries is too strong. If this condition is not controlled, it may put you at risk for serious complications.  Your personal target blood pressure may vary depending on your medical conditions, your age, and other factors. For most people, a normal blood pressure is less than 120/80.  Hypertension is managed by lifestyle changes, medicines, or both. Lifestyle changes include weight loss, eating a healthy, low-sodium diet, exercising more, and limiting alcohol. This information is not intended to replace advice given to you by your health care provider. Make sure you discuss any questions you have with your health care provider. Document Revised: 12/10/2018 Document Reviewed: 07/16/2016 Elsevier Patient Education  2020 Elsevier Inc.  

## 2019-12-14 ENCOUNTER — Encounter: Payer: Self-pay | Admitting: Adult Health Nurse Practitioner

## 2019-12-14 ENCOUNTER — Ambulatory Visit: Payer: 59 | Admitting: Adult Health Nurse Practitioner

## 2019-12-14 ENCOUNTER — Other Ambulatory Visit: Payer: Self-pay

## 2019-12-14 VITALS — BP 131/91 | HR 96 | Temp 98.0°F | Ht 72.0 in | Wt 189.4 lb

## 2019-12-14 DIAGNOSIS — I10 Essential (primary) hypertension: Secondary | ICD-10-CM | POA: Diagnosis not present

## 2019-12-14 DIAGNOSIS — E860 Dehydration: Secondary | ICD-10-CM

## 2019-12-14 LAB — POCT URINALYSIS DIP (MANUAL ENTRY)
Bilirubin, UA: NEGATIVE
Blood, UA: NEGATIVE
Glucose, UA: >=1000 mg/dL — AB
Leukocytes, UA: NEGATIVE
Nitrite, UA: NEGATIVE
Protein Ur, POC: NEGATIVE mg/dL
Spec Grav, UA: <=1.005 — AB (ref 1.010–1.025)
Urobilinogen, UA: 0.2 E.U./dL
pH, UA: 5 (ref 5.0–8.0)

## 2019-12-14 LAB — GLUCOSE, POCT (MANUAL RESULT ENTRY)

## 2019-12-14 NOTE — Progress Notes (Signed)
  Chief Complaint  Patient presents with  . thirstry  . Anorexia  . Fatigue    all sx going on for 5 days     HPI   Patient presents today with a 1 week history of extreme thirst, frequent urination, fatigue, nausea and vomiting.  Per last visit he did have some elevated blood sugars but today his blood sugar was elevated to the point it would not register on the glucometer.  He endorses feeling very poorly, nauseous.  He vomited this morning.  Mild for blurry vision.  He works for himself, is a [employer] architect.  He states he is drinking approximately 3 to 4 gallons of water a day and still cannot feel hydrated.  We discussed management of blood sugar that is too high to even calculate.  I believe he is in diabetic ketoacidosis and have advised the ER.  Problem List    Problem List: 2019-07: Controlled type 2 diabetes mellitus without complication,  without long-term current use of insulin (HCC) 2019-07: Hypertension 2018-07: Elevated blood pressure reading   Allergies   has No Known Allergies.  Medications    Current Outpatient Medications:  .  hydrochlorothiazide (HYDRODIURIL) 25 MG tablet, Take 1 tablet (25 mg total) by mouth daily., Disp: 30 tablet, Rfl: 3 .  metFORMIN (GLUCOPHAGE) 500 MG tablet, Take 1 tablet (500 mg total) by mouth daily with breakfast., Disp: 30 tablet, Rfl: 1   Review of Systems    Polyuria, polydipsia, fatigue, anorexia, nausea, and vomiting.    Physical Exam:    height is 6' (1.829 m) and weight is 189 lb 6.4 oz (85.9 kg). His temporal temperature is 98 F (36.7 C). His blood pressure is 131/91 (abnormal) and his pulse is 96. His oxygen saturation is 98%.   General appearance: Patient does not appear well.  Chest: clear to auscultation, no wheezes, rales or rhonchi, symmetric air entry.  CVS exam: normal rate, regular rhythm, normal S1, S2, no murmurs, rubs, clicks or gallops.   Lab Ardine Bjork Review   Lab Results  Component Value Date   HGBA1C  7.5 (H) 10/14/2019   HGBA1C 6.6 (H) 04/24/2018   HGBA1C 6.5 (H) 03/26/2018   Lab Results  Component Value Date   LDLCALC 92 10/14/2019   CREATININE 1.13 10/14/2019    Assessment & Plan:  Dominic Hill is a 49 y.o. male   No orders of the defined types were placed in this encounter.  Diabetic Ketoacidosis with Dehydration.  I have advised him to go to the emergency room.  Given his blood sugar wouldn't register, the length and severity of his symptoms, as well as its acute onset, I have sent him to be evaluated and treated at ER.  Explained they can get blood sugar under control and get patient hydrated. Will see him the day of or day after discharge.  He is inline with this plan.   Elyse Jarvis, NP   Elyse Jarvis, NP

## 2019-12-14 NOTE — Patient Instructions (Signed)
° ° ° °  If you have lab work done today you will be contacted with your lab results within the next 2 weeks.  If you have not heard from us then please contact us. The fastest way to get your results is to register for My Chart. ° ° °IF you received an x-ray today, you will receive an invoice from [facility]. Please contact [facility] at [phone] with questions or concerns regarding your invoice.  ° °IF you received labwork today, you will receive an invoice from [facility]. Please contact [facility] at [phone] with questions or concerns regarding your invoice.  ° °Our billing staff will not be able to assist you with questions regarding bills from these companies. ° °You will be contacted with the lab results as soon as they are available. The fastest way to get your results is to activate your My Chart account. Instructions are located on the last page of this paperwork. If you have not heard from us regarding the results in 2 weeks, please contact this office. °  ° ° ° °

## 2019-12-15 ENCOUNTER — Telehealth: Payer: Self-pay

## 2019-12-15 ENCOUNTER — Encounter (HOSPITAL_COMMUNITY): Payer: Self-pay

## 2019-12-15 ENCOUNTER — Emergency Department (HOSPITAL_COMMUNITY)
Admission: EM | Admit: 2019-12-15 | Discharge: 2019-12-15 | Disposition: A | Discharge status: Home / Self Care | Payer: 59 | Attending: Emergency Medicine | Admitting: Emergency Medicine

## 2019-12-15 ENCOUNTER — Other Ambulatory Visit: Payer: Self-pay

## 2019-12-15 DIAGNOSIS — Z7984 Long term (current) use of oral hypoglycemic drugs: Secondary | ICD-10-CM | POA: Insufficient documentation

## 2019-12-15 DIAGNOSIS — I10 Essential (primary) hypertension: Secondary | ICD-10-CM | POA: Insufficient documentation

## 2019-12-15 DIAGNOSIS — E1165 Type 2 diabetes mellitus with hyperglycemia: Secondary | ICD-10-CM | POA: Diagnosis not present

## 2019-12-15 DIAGNOSIS — R112 Nausea with vomiting, unspecified: Secondary | ICD-10-CM | POA: Diagnosis present

## 2019-12-15 HISTORY — DX: Essential (primary) hypertension: I10

## 2019-12-15 HISTORY — DX: Type 2 diabetes mellitus without complications: E11.9

## 2019-12-15 LAB — COMPREHENSIVE METABOLIC PANEL
ALT: 23 U/L (ref 0–44)
AST: 15 U/L (ref 15–41)
Albumin: 4.8 g/dL (ref 3.5–5.0)
Alkaline Phosphatase: 75 U/L (ref 38–126)
Anion gap: 16 — ABNORMAL HIGH (ref 5–15)
BUN: 20 mg/dL (ref 6–20)
CO2: 28 mmol/L (ref 22–32)
Calcium: 10.3 mg/dL (ref 8.9–10.3)
Chloride: 93 mmol/L — ABNORMAL LOW (ref 98–111)
Creatinine, Ser: 1.63 mg/dL — ABNORMAL HIGH (ref 0.61–1.24)
GFR calc Af Amer: 57 mL/min — ABNORMAL LOW (ref >60)
GFR calc non Af Amer: 49 mL/min — ABNORMAL LOW (ref >60)
Glucose, Bld: 605 mg/dL (ref 70–99)
Potassium: 3.9 mmol/L (ref 3.5–5.1)
Sodium: 137 mmol/L (ref 135–145)
Total Bilirubin: 2.2 mg/dL — ABNORMAL HIGH (ref 0.3–1.2)
Total Protein: 8.5 g/dL — ABNORMAL HIGH (ref 6.5–8.1)

## 2019-12-15 LAB — URINALYSIS, ROUTINE W REFLEX MICROSCOPIC
Bacteria, UA: NONE SEEN
Bilirubin Urine: NEGATIVE
Glucose, UA: >=500 mg/dL — AB
Hgb urine dipstick: NEGATIVE
Ketones, ur: 5 mg/dL — AB
Leukocytes,Ua: NEGATIVE
Nitrite: NEGATIVE
Protein, ur: NEGATIVE mg/dL
Specific Gravity, Urine: 1.021 (ref 1.005–1.030)
pH: 5 (ref 5.0–8.0)

## 2019-12-15 LAB — CBC
HCT: 47.5 % (ref 39.0–52.0)
Hemoglobin: 15.7 g/dL (ref 13.0–17.0)
MCH: 27.5 pg (ref 26.0–34.0)
MCHC: 33.1 g/dL (ref 30.0–36.0)
MCV: 83.3 fL (ref 80.0–100.0)
Platelets: 234 10*3/uL (ref 150–400)
RBC: 5.7 MIL/uL (ref 4.22–5.81)
RDW: 13 % (ref 11.5–15.5)
WBC: 7 10*3/uL (ref 4.0–10.5)
nRBC: 0 % (ref 0.0–0.2)

## 2019-12-15 LAB — CBG MONITORING, ED
Glucose-Capillary: 564 mg/dL (ref 70–99)
Glucose-Capillary: 584 mg/dL (ref 70–99)
Glucose-Capillary: 511 mg/dL (ref 70–99)
Glucose-Capillary: 386 mg/dL — ABNORMAL HIGH (ref 70–99)
Glucose-Capillary: 256 mg/dL — ABNORMAL HIGH (ref 70–99)
Glucose-Capillary: 214 mg/dL — ABNORMAL HIGH (ref 70–99)

## 2019-12-15 LAB — LIPASE, BLOOD: Lipase: 31 U/L (ref 11–51)

## 2019-12-15 MED ORDER — SODIUM CHLORIDE 0.9 % IV BOLUS (SEPSIS)
1000.0000 mL | Freq: Once | INTRAVENOUS | Status: AC
  Administered 2019-12-15: 1000 mL via INTRAVENOUS

## 2019-12-15 MED ORDER — ONDANSETRON HCL 4 MG/2ML IJ SOLN
4.0000 mg | Freq: Once | INTRAMUSCULAR | Status: AC
  Administered 2019-12-15: 4 mg via INTRAVENOUS
  Filled 2019-12-15: qty 2

## 2019-12-15 MED ORDER — INSULIN ASPART 100 UNIT/ML ~~LOC~~ SOLN
10.0000 [IU] | Freq: Once | SUBCUTANEOUS | Status: AC
  Administered 2019-12-15: 10 [IU] via INTRAVENOUS

## 2019-12-15 MED ORDER — INSULIN ASPART 100 UNIT/ML ~~LOC~~ SOLN
10.0000 [IU] | Freq: Once | SUBCUTANEOUS | Status: AC
  Administered 2019-12-15: 10 [IU] via SUBCUTANEOUS

## 2019-12-15 NOTE — ED Provider Notes (Signed)
[facility] EMERGENCY DEPARTMENT Provider Note   CSN: [id number] Arrival date & time: 12/15/19  0855     History Chief Complaint  Patient presents with  . Emesis    Dominic Hill is a 49 y.o. male.  Patient is a 49 year old gentleman with past medical history of type 2 diabetes, hypertension presenting to the emergency department for hyperglycemia.  Patient saw his primary care doctor yesterday for polydipsia and polyuria.  Was found to have hyperglycemia and told to come to the emergency department.  Patient reports that for the last few months he has had excessive thirst and drinking lots of water but still feeling thirsty.  Also reports anorexia, polyuria and nausea and vomiting starting yesterday.  Denies any fever, chills, cough, dysuria.  He takes Metformin once daily and has not missed any doses.  He reports that he does not check his glucose at home.        Past Medical History:  Diagnosis Date  . Diabetes mellitus without complication ([place])   . Hypertension     Patient Active Problem List   Diagnosis Date Noted  . Controlled type 2 diabetes mellitus without complication, without long-term current use of insulin ([place]) 03/29/2018  . Hypertension 03/29/2018  . Elevated blood pressure reading 03/28/2017    History reviewed. No pertinent surgical history.     Family History  Problem Relation Age of Onset  . Hypertension Sister     Social History   Tobacco Use  . Smoking status: Never Smoker  . Smokeless tobacco: Never Used  Substance Use Topics  . Alcohol use: No    Alcohol/week: 0.0 standard drinks  . Drug use: No    Home Medications Prior to Admission medications   Medication Sig Start Date End Date Taking? Authorizing Provider  hydrochlorothiazide (HYDRODIURIL) 25 MG tablet Take 1 tablet (25 mg total) by mouth daily. 11/25/19 12/25/19 Yes Wendall Mola, NP  metFORMIN (GLUCOPHAGE) 500 MG tablet Take 1 tablet (500 mg total) by  mouth daily with breakfast. 12/16/19 01/15/20  Wendall Mola, NP    Allergies    Patient has no known allergies.  Review of Systems   Review of Systems  Constitutional: Positive for appetite change. Negative for chills and fever.  HENT: Negative for ear pain and sore throat.   Eyes: Negative for pain and visual disturbance.  Respiratory: Negative for cough and shortness of breath.   Cardiovascular: Negative for chest pain and palpitations.  Gastrointestinal: Positive for nausea and vomiting. Negative for abdominal pain.  Endocrine: Positive for polydipsia and polyuria.  Genitourinary: Negative for dysuria and hematuria.  Musculoskeletal: Negative for arthralgias and back pain.  Skin: Negative for color change and rash.  Neurological: Negative for seizures and syncope.  All other systems reviewed and are negative.   Physical Exam Updated Vital Signs BP 135/90   Pulse 64   Temp 98.3 F (36.8 C) (Oral)   Resp 12   Ht 6' (1.829 m)   Wt 85.3 kg   SpO2 100%   BMI 25.50 kg/m   Physical Exam Vitals and nursing note reviewed.  Constitutional:      Appearance: Normal appearance. He is not ill-appearing or diaphoretic.  HENT:     Head: Normocephalic.     Mouth/Throat:     Mouth: Mucous membranes are moist.  Eyes:     Conjunctiva/sclera: Conjunctivae normal.  Cardiovascular:     Rate and Rhythm: Normal rate and regular rhythm.  Pulmonary:  Effort: Pulmonary effort is normal. No respiratory distress.     Breath sounds: Normal breath sounds. No wheezing.  Chest:     Chest wall: No tenderness.  Abdominal:     General: Abdomen is flat.     Palpations: Abdomen is soft.     Tenderness: There is no guarding.  Skin:    General: Skin is dry.  Neurological:     Mental Status: He is alert.  Psychiatric:        Mood and Affect: Mood normal.     ED Results / Procedures / Treatments   Labs (all labs ordered are listed, but only abnormal results are displayed) Labs  Reviewed  COMPREHENSIVE METABOLIC PANEL - Abnormal; Notable for the following components:      Result Value   Chloride 93 (*)    Glucose, Bld 605 (*)    Creatinine, Ser 1.63 (*)    Total Protein 8.5 (*)    Total Bilirubin 2.2 (*)    GFR calc non Af Amer 49 (*)    GFR calc Af Amer 57 (*)    Anion gap 16 (*)    All other components within normal limits  URINALYSIS, ROUTINE W REFLEX MICROSCOPIC - Abnormal; Notable for the following components:   Color, Urine STRAW (*)    Glucose, UA >=500 (*)    Ketones, ur 5 (*)    All other components within normal limits  CBG MONITORING, ED - Abnormal; Notable for the following components:   Glucose-Capillary 564 (*)    All other components within normal limits  CBG MONITORING, ED - Abnormal; Notable for the following components:   Glucose-Capillary 584 (*)    All other components within normal limits  CBG MONITORING, ED - Abnormal; Notable for the following components:   Glucose-Capillary 511 (*)    All other components within normal limits  CBG MONITORING, ED - Abnormal; Notable for the following components:   Glucose-Capillary 386 (*)    All other components within normal limits  CBG MONITORING, ED - Abnormal; Notable for the following components:   Glucose-Capillary 256 (*)    All other components within normal limits  CBG MONITORING, ED - Abnormal; Notable for the following components:   Glucose-Capillary 214 (*)    All other components within normal limits  LIPASE, BLOOD  CBC  CBG MONITORING, ED    EKG None  Radiology No results found.  Procedures Procedures (including critical care time)  Medications Ordered in ED Medications  sodium chloride 0.9 % bolus 1,000 mL (0 mLs Intravenous Stopped 12/15/19 1132)  insulin aspart (novoLOG) injection 10 Units (10 Units Subcutaneous Given 12/15/19 1045)  ondansetron (ZOFRAN) injection 4 mg (4 mg Intravenous Given 12/15/19 1044)  sodium chloride 0.9 % bolus 1,000 mL (0 mLs Intravenous  Stopped 12/15/19 1240)  insulin aspart (novoLOG) injection 10 Units (10 Units Intravenous Given 12/15/19 1222)  sodium chloride 0.9 % bolus 1,000 mL (0 mLs Intravenous Stopped 12/15/19 1525)    ED Course  I have reviewed the triage vital signs and the nursing notes.  Pertinent labs & imaging results that were available during my care of the patient were reviewed by me and considered in my medical decision making (see chart for details).  Clinical Course as of Dec 18 1933  Thu Dec 15, 2019  1022 Type II diabetic presenting with polydipsia, polyuria and hyperglycemia.  Overall well-appearing on my exam.  Mildly hypertensive but otherwise normal vitals and does not appearing in acute distress.  CBG on arrival was 564.  Labs pending, fluids, insulin, Zofran ordered..   [KM]  1448 Patient improved with fluids and insuline. CBG currently 256. Patient does not appear to be in DKA. Disucsssed with patient admission vs discharge and patient would like to go home.  He is well-appearing and tolerating p.o.  He has an established PCP who he may follow-up with.  We will increase his Metformin today and have him follow-up closely with primary care doctor.  Discussed return precautions.   [KM]    Clinical Course User Index [KM] Jeral Pinch   MDM Rules/Calculators/A&P                      Based on review of vitals, medical screening exam, lab work and/or imaging, there does not appear to be an acute, emergent etiology for the patient's symptoms. Counseled pt on good return precautions and encouraged both PCP and ED follow-up as needed.  Prior to discharge, I also discussed incidental imaging findings with patient in detail and advised appropriate, recommended follow-up in detail.  Clinical Impression: 1. Type 2 diabetes mellitus with hyperglycemia, without long-term current use of insulin (HCC)     Disposition: Discharge  Prior to providing a prescription for a controlled substance, I  independently reviewed the patient's recent prescription history on the [place] Controlled Substance Reporting System. The patient had no recent or regular prescriptions and was deemed appropriate for a brief, less than 3 day prescription of narcotic for acute analgesia.  This note was prepared with assistance of Conservation officer, historic buildings. Occasional wrong-word or sound-a-like substitutions may have occurred due to the inherent limitations of voice recognition software.  Final Clinical Impression(s) / ED Diagnoses Final diagnoses:  Type 2 diabetes mellitus with hyperglycemia, without long-term current use of insulin [facility])    Rx / DC Orders ED Discharge Orders    None       Jeral Pinch 12/19/19 Jaynee Eagles, MD 12/20/19 [phone]

## 2019-12-15 NOTE — ED Provider Notes (Signed)
  Face-to-face evaluation   History: He presents for sensation of dehydration, despite taking his usual medications.  He denies vomiting or diarrhea.  Patient states that he is thirsty.   Physical exam: Slender, alert and cooperative.  No dysarthria or aphasia.  No respiratory distress.  Medical screening examination/treatment/procedure(s) were conducted as a shared visit with non-physician practitioner(s) and myself.  I personally evaluated the patient during the encounter    Mancel Bale, MD 12/20/19 [phone]

## 2019-12-15 NOTE — Telephone Encounter (Signed)
Left message for pt to call bk 

## 2019-12-15 NOTE — ED Triage Notes (Addendum)
Pt sent by PCP for dehydration and emesis, denies abd pain or diarrhea. PCP note from yesterday reports CBG meter reading high, sent here for possible DKA. Pt a.o, nad noted

## 2019-12-15 NOTE — Discharge Instructions (Addendum)
You are seen today for very high blood sugars.  It appears that your diabetes is out of control.  We have given you lots of fluids, insulin and nausea medication.  We are increasing your Metformin medication.  You need to follow-up with your primary care doctor very soon.  If you have any new or worsening symptoms then please return for reevaluation in the emergency department. Your kidney function tests were slightly abnormal today but the medicines we gave you should help to improve this. Make sure your see your regular doctor soon. Thank you for allowing me to care for you today. Please return to the emergency department if you have new or worsening symptoms. Take your medications as instructed.

## 2019-12-15 NOTE — ED Notes (Signed)
Pt provided water pitcher per MD request

## 2019-12-15 NOTE — ED Notes (Signed)
Pt d/c home per MD order. Discharge summary reviewed, pt verbalizes understanding. Pt voicng no complaints at discharge. Off unit via w/c. No s/s of acute distress noted.

## 2019-12-16 ENCOUNTER — Other Ambulatory Visit: Payer: Self-pay | Admitting: Adult Health Nurse Practitioner

## 2019-12-16 DIAGNOSIS — E119 Type 2 diabetes mellitus without complications: Secondary | ICD-10-CM

## 2019-12-16 MED ORDER — METFORMIN HCL 500 MG PO TABS: 500.0000 mg | ORAL_TABLET | Freq: Every day | ORAL | 0 refills | Status: DC

## 2019-12-16 NOTE — Telephone Encounter (Signed)
Copied from CRM [id number]. Topic: Quick Communication - Rx Refill/Question >> Dec 16, 2019 10:46 AM Jaquita Rector A wrote: Medication: metFORMIN (GLUCOPHAGE) 500 MG tablet  Has the patient contacted their pharmacy? Yes.   (Agent: If no, request that the patient contact the pharmacy for the refill.) (Agent: If yes, when and what did the pharmacy advise?)  Preferred Pharmacy (with phone number or street name): [facility] [address] [place], [place] - [address]  Phone:  [phone] Fax:  [phone]     Agent: Please be advised that RX refills may take up to 3 business days. We ask that you follow-up with your pharmacy.

## 2019-12-16 NOTE — Telephone Encounter (Signed)
Requested Prescriptions  Pending Prescriptions Disp Refills  . metFORMIN (GLUCOPHAGE) 500 MG tablet 30 tablet 1    Sig: Take 1 tablet (500 mg total) by mouth daily with breakfast.     Endocrinology:  Diabetes - Biguanides Failed - 12/16/2019 10:49 AM      Failed - Cr in normal range and within 360 days    Creatinine, Ser  Date Value Ref Range Status  12/15/2019 1.63 (H) 0.61 - 1.24 mg/dL Final         Failed - eGFR in normal range and within 360 days    GFR calc Af Amer  Date Value Ref Range Status  12/15/2019 57 (L) >60 mL/min Final   GFR calc non Af Amer  Date Value Ref Range Status  12/15/2019 49 (L) >60 mL/min Final         Passed - HBA1C is between 0 and 7.9 and within 180 days    Hgb A1c MFr Bld  Date Value Ref Range Status  10/14/2019 7.5 (H) 4.8 - 5.6 % Final    Comment:             Prediabetes: 5.7 - 6.4          Diabetes: >6.4          Glycemic control for adults with diabetes: <7.0          Passed - Valid encounter within last 6 months    Recent Outpatient Visits          2 days ago Dehydration   Primary Care at [facility], Lorelee Market, NP   3 weeks ago Hypertension, unspecified type   Primary Care at [facility], Lorelee Market, NP   2 months ago Need for prophylactic vaccination and inoculation against influenza   Primary Care at [facility], Lorelee Market, NP   1 year ago Hypertension, unspecified type   Primary Care at [facility], Audrie Lia, PA-C   1 year ago Essential hypertension   Primary Care at [facility], PA-C      Future Appointments            In 3 weeks Felton Clinton Lorelee Market, NP Primary Care at [place], [facility]

## 2020-01-06 ENCOUNTER — Ambulatory Visit: Payer: 59 | Admitting: Adult Health Nurse Practitioner

## 2020-01-06 ENCOUNTER — Ambulatory Visit: Payer: 59 | Admitting: Family Medicine

## 2020-01-10 ENCOUNTER — Encounter: Payer: Self-pay | Admitting: Family Medicine

## 2020-03-10 ENCOUNTER — Other Ambulatory Visit: Payer: Self-pay

## 2020-03-10 ENCOUNTER — Encounter (HOSPITAL_COMMUNITY): Payer: Self-pay

## 2020-03-10 ENCOUNTER — Emergency Department (HOSPITAL_COMMUNITY)
Admission: EM | Admit: 2020-03-10 | Discharge: 2020-03-10 | Disposition: A | Discharge status: Home / Self Care | Payer: 59 | Attending: Emergency Medicine | Admitting: Emergency Medicine

## 2020-03-10 DIAGNOSIS — Z79899 Other long term (current) drug therapy: Secondary | ICD-10-CM | POA: Diagnosis not present

## 2020-03-10 DIAGNOSIS — E1165 Type 2 diabetes mellitus with hyperglycemia: Secondary | ICD-10-CM | POA: Insufficient documentation

## 2020-03-10 DIAGNOSIS — E785 Hyperlipidemia, unspecified: Secondary | ICD-10-CM | POA: Diagnosis not present

## 2020-03-10 DIAGNOSIS — I10 Essential (primary) hypertension: Secondary | ICD-10-CM | POA: Diagnosis not present

## 2020-03-10 DIAGNOSIS — Z7984 Long term (current) use of oral hypoglycemic drugs: Secondary | ICD-10-CM | POA: Diagnosis not present

## 2020-03-10 DIAGNOSIS — E781 Pure hyperglyceridemia: Secondary | ICD-10-CM | POA: Diagnosis not present

## 2020-03-10 DIAGNOSIS — E786 Lipoprotein deficiency: Secondary | ICD-10-CM

## 2020-03-10 LAB — URINALYSIS, ROUTINE W REFLEX MICROSCOPIC
Bacteria, UA: NONE SEEN
Bilirubin Urine: NEGATIVE
Glucose, UA: >=500 mg/dL — AB
Hgb urine dipstick: NEGATIVE
Ketones, ur: 20 mg/dL — AB
Leukocytes,Ua: NEGATIVE
Nitrite: NEGATIVE
Protein, ur: NEGATIVE mg/dL
Specific Gravity, Urine: 1.02 (ref 1.005–1.030)
pH: 5 (ref 5.0–8.0)

## 2020-03-10 LAB — COMPREHENSIVE METABOLIC PANEL
ALT: 29 U/L (ref 0–44)
AST: 16 U/L (ref 15–41)
Albumin: 4 g/dL (ref 3.5–5.0)
Alkaline Phosphatase: 70 U/L (ref 38–126)
Anion gap: 13 (ref 5–15)
BUN: 15 mg/dL (ref 6–20)
CO2: 23 mmol/L (ref 22–32)
Calcium: 9.6 mg/dL (ref 8.9–10.3)
Chloride: 102 mmol/L (ref 98–111)
Creatinine, Ser: 1.05 mg/dL (ref 0.61–1.24)
GFR calc Af Amer: >60 mL/min (ref >60)
GFR calc non Af Amer: >60 mL/min (ref >60)
Glucose, Bld: 444 mg/dL — ABNORMAL HIGH (ref 70–99)
Potassium: 5.7 mmol/L — ABNORMAL HIGH (ref 3.5–5.1)
Sodium: 138 mmol/L (ref 135–145)
Total Bilirubin: 1.5 mg/dL — ABNORMAL HIGH (ref 0.3–1.2)
Total Protein: 7 g/dL (ref 6.5–8.1)

## 2020-03-10 LAB — TROPONIN I (HIGH SENSITIVITY)
Troponin I (High Sensitivity): 3 ng/L (ref <18)
Troponin I (High Sensitivity): 3 ng/L (ref <18)

## 2020-03-10 LAB — I-STAT VENOUS BLOOD GAS, ED
Acid-base deficit: 4 mmol/L — ABNORMAL HIGH (ref 0.0–2.0)
Bicarbonate: 23.2 mmol/L (ref 20.0–28.0)
Calcium, Ion: 1.27 mmol/L (ref 1.15–1.40)
HCT: 40 % (ref 39.0–52.0)
Hemoglobin: 13.6 g/dL (ref 13.0–17.0)
O2 Saturation: 90 %
Potassium: 5.8 mmol/L — ABNORMAL HIGH (ref 3.5–5.1)
Sodium: 144 mmol/L (ref 135–145)
TCO2: 25 mmol/L (ref 22–32)
pCO2, Ven: 52.2 mmHg (ref 44.0–60.0)
pH, Ven: 7.256 (ref 7.250–7.430)
pO2, Ven: 70 mmHg — ABNORMAL HIGH (ref 32.0–45.0)

## 2020-03-10 LAB — LIPID PANEL
Cholesterol: 217 mg/dL — ABNORMAL HIGH (ref 0–200)
HDL: 29 mg/dL — ABNORMAL LOW (ref >40)
LDL Cholesterol: UNDETERMINED mg/dL (ref 0–99)
Total CHOL/HDL Ratio: 7.5 RATIO
Triglycerides: 723 mg/dL — ABNORMAL HIGH (ref <150)
VLDL: UNDETERMINED mg/dL (ref 0–40)

## 2020-03-10 LAB — BASIC METABOLIC PANEL
Anion gap: 13 (ref 5–15)
BUN: 12 mg/dL (ref 6–20)
CO2: 17 mmol/L — ABNORMAL LOW (ref 22–32)
Calcium: 9.2 mg/dL (ref 8.9–10.3)
Chloride: 109 mmol/L (ref 98–111)
Creatinine, Ser: 0.96 mg/dL (ref 0.61–1.24)
GFR calc Af Amer: >60 mL/min (ref >60)
GFR calc non Af Amer: >60 mL/min (ref >60)
Glucose, Bld: 366 mg/dL — ABNORMAL HIGH (ref 70–99)
Potassium: 5.5 mmol/L — ABNORMAL HIGH (ref 3.5–5.1)
Sodium: 139 mmol/L (ref 135–145)

## 2020-03-10 LAB — CBC
HCT: 42.9 % (ref 39.0–52.0)
Hemoglobin: 15 g/dL (ref 13.0–17.0)
MCH: 29.8 pg (ref 26.0–34.0)
MCHC: 35 g/dL (ref 30.0–36.0)
MCV: 85.1 fL (ref 80.0–100.0)
Platelets: 196 10*3/uL (ref 150–400)
RBC: 5.04 MIL/uL (ref 4.22–5.81)
RDW: 13.4 % (ref 11.5–15.5)
WBC: 4.9 10*3/uL (ref 4.0–10.5)
nRBC: 0 % (ref 0.0–0.2)

## 2020-03-10 LAB — CBG MONITORING, ED
Glucose-Capillary: 429 mg/dL — ABNORMAL HIGH (ref 70–99)
Glucose-Capillary: 298 mg/dL — ABNORMAL HIGH (ref 70–99)

## 2020-03-10 LAB — LDL CHOLESTEROL, DIRECT: Direct LDL: 64.7 mg/dL (ref 0–99)

## 2020-03-10 MED ORDER — ATORVASTATIN CALCIUM 10 MG PO TABS
20.0000 mg | ORAL_TABLET | Freq: Every day | ORAL | 0 refills | Status: DC
Start: 2020-03-10 — End: 2020-06-22

## 2020-03-10 MED ORDER — SODIUM CHLORIDE 0.9% FLUSH: 3.0000 mL | Freq: Once | INTRAVENOUS | Status: DC

## 2020-03-10 MED ORDER — SODIUM CHLORIDE 0.9 % IV BOLUS
1000.0000 mL | Freq: Once | INTRAVENOUS | Status: AC
  Administered 2020-03-10: 1000 mL via INTRAVENOUS

## 2020-03-10 NOTE — ED Provider Notes (Signed)
[facility] EMERGENCY DEPARTMENT Provider Note   CSN: [id number] Arrival date & time: 03/10/20  4098     History No chief complaint on file.   Dominic Hill is a 49 y.o. male.  HPI   Patient is a 49 year old male with a history of diabetes mellitus as well as hypertension.  Patient states for the last 2 weeks he has been feeling dehydrated and increasingly fatigued with associated polyuria and polydipsia.  Patient endorses history of diabetes mellitus and based on records it appears that he experienced a similar episode of fatigue/dehydration/polyuria/polydipsia, 3 months ago. He takes metformin once per day for his DM. He denies n/v/d, CP, SOB, dizziness, lightheadedness, syncope, dysuria, fevers, chills.      Past Medical History:  Diagnosis Date   Diabetes mellitus without complication (HCC)    Hypertension     Patient Active Problem List   Diagnosis Date Noted   Controlled type 2 diabetes mellitus without complication, without long-term current use of insulin (HCC) 03/29/2018   Hypertension 03/29/2018   Elevated blood pressure reading 03/28/2017    History reviewed. No pertinent surgical history.     Family History  Problem Relation Age of Onset   Hypertension Sister     Social History   Tobacco Use   Smoking status: Never Smoker   Smokeless tobacco: Never Used  Substance Use Topics   Alcohol use: No    Alcohol/week: 0.0 standard drinks   Drug use: No    Home Medications Prior to Admission medications   Medication Sig Start Date End Date Taking? Authorizing Provider  hydrochlorothiazide (HYDRODIURIL) 25 MG tablet Take 1 tablet (25 mg total) by mouth daily. 11/25/19 12/25/19  Royal Hawthorn, NP  metFORMIN (GLUCOPHAGE) 500 MG tablet Take 1 tablet (500 mg total) by mouth daily with breakfast. 12/16/19 01/15/20  Royal Hawthorn, NP    Allergies    Patient has no known allergies.  Review of Systems   Review of Systems    All other systems reviewed and are negative. Ten systems reviewed and are negative for acute change, except as noted in the HPI.    Physical Exam Updated Vital Signs BP (!) 149/91    Pulse 60    Temp 97.9 F (36.6 C) (Oral)    Resp 16    Ht 6' (1.829 m)    Wt 78 kg    SpO2 100%    BMI 23.33 kg/m   Physical Exam Vitals and nursing note reviewed.  Constitutional:      General: He is not in acute distress.    Appearance: Normal appearance. He is not ill-appearing, toxic-appearing or diaphoretic.  HENT:     Head: Normocephalic and atraumatic.     Right Ear: External ear normal.     Left Ear: External ear normal.     Nose: Nose normal.     Mouth/Throat:     Mouth: Mucous membranes are dry.     Pharynx: Oropharynx is clear. No oropharyngeal exudate or posterior oropharyngeal erythema.  Eyes:     General: No scleral icterus.       Right eye: No discharge.        Left eye: No discharge.     Extraocular Movements: Extraocular movements intact.     Conjunctiva/sclera: Conjunctivae normal.     Pupils: Pupils are equal, round, and reactive to light.  Cardiovascular:     Rate and Rhythm: Normal rate and regular rhythm.     Pulses: Normal pulses.  Heart sounds: Normal heart sounds. No murmur heard.  No friction rub. No gallop.   Pulmonary:     Effort: Pulmonary effort is normal. No respiratory distress.     Breath sounds: Normal breath sounds. No stridor. No wheezing, rhonchi or rales.  Abdominal:     General: Abdomen is flat.     Palpations: Abdomen is soft.     Tenderness: There is no abdominal tenderness.  Musculoskeletal:        General: Normal range of motion.     Cervical back: Normal range of motion and neck supple. No tenderness.  Skin:    General: Skin is warm and dry.  Neurological:     General: No focal deficit present.     Mental Status: He is alert and oriented to person, place, and time.  Psychiatric:        Mood and Affect: Mood normal.        Behavior:  Behavior normal.    ED Results / Procedures / Treatments   Labs (all labs ordered are listed, but only abnormal results are displayed) Labs Reviewed  URINALYSIS, ROUTINE W REFLEX MICROSCOPIC - Abnormal; Notable for the following components:      Result Value   Color, Urine STRAW (*)    Glucose, UA >=500 (*)    Ketones, ur 20 (*)    All other components within normal limits  COMPREHENSIVE METABOLIC PANEL - Abnormal; Notable for the following components:   Potassium 5.7 (*)    Glucose, Bld 444 (*)    Total Bilirubin 1.5 (*)    All other components within normal limits  BASIC METABOLIC PANEL - Abnormal; Notable for the following components:   Potassium 5.5 (*)    CO2 17 (*)    Glucose, Bld 366 (*)    All other components within normal limits  LIPID PANEL - Abnormal; Notable for the following components:   Cholesterol 217 (*)    Triglycerides 723 (*)    HDL 29 (*)    All other components within normal limits  CBG MONITORING, ED - Abnormal; Notable for the following components:   Glucose-Capillary 429 (*)    All other components within normal limits  CBG MONITORING, ED - Abnormal; Notable for the following components:   Glucose-Capillary 298 (*)    All other components within normal limits  I-STAT VENOUS BLOOD GAS, ED - Abnormal; Notable for the following components:   pO2, Ven 70.0 (*)    Acid-base deficit 4.0 (*)    Potassium 5.8 (*)    All other components within normal limits  CBC  LDL CHOLESTEROL, DIRECT  TROPONIN I (HIGH SENSITIVITY)  TROPONIN I (HIGH SENSITIVITY)    EKG EKG Interpretation  Date/Time:  Saturday March 10 2020 09:59:03 EDT Ventricular Rate:  60 PR Interval:    QRS Duration: 131 QT Interval:  421 QTC Calculation: 421 R Axis:   38 Text Interpretation: Sinus rhythm Right bundle branch block ST elevation, consider anterolateral injury No old tracing to compare Confirmed by Jacalyn Lefevre [phone]) on 03/10/2020 10:04:26 AM  Radiology No results  found.  Procedures Procedures (including critical care time)  Medications Ordered in ED Medications  sodium chloride flush (NS) 0.9 % injection 3 mL (has no administration in time range)  sodium chloride 0.9 % bolus 1,000 mL (0 mLs Intravenous Stopped 03/10/20 1135)  sodium chloride 0.9 % bolus 1,000 mL (0 mLs Intravenous Stopped 03/10/20 1333)   ED Course  I have reviewed the triage vital signs  and the nursing notes.  Pertinent labs & imaging results that were available during my care of the patient were reviewed by me and considered in my medical decision making (see chart for details).  Clinical Course as of Mar 10 1438  Sat Mar 10, 2020  0911 Anion gap: 13 [LJ]  0911 Potassium(!): 5.7 [LJ]  0911 Glucose, UA(!): >=500 [LJ]  0911 Ketones, ur(!): 20 [LJ]  0912 Glucose-Capillary(!): 429 [LJ]  1246 Down from 5.7 after 1L of fluids.  Potassium(!): 5.5 [LJ]  1246 Glucose-Capillary(!): 298 [LJ]  1246 CO2(!): 17 [LJ]  1438 Cholesterol(!): 217 [LJ]  1438 Triglycerides(!): 723 [LJ]  1438 HDL Cholesterol(!): 29 [LJ]  1438 Glucose-Capillary(!): 298 [LJ]  1438 Troponin I (High Sensitivity): 3 [LJ]  1438 Direct LDL: 64.7 [LJ]    Clinical Course User Index [LJ] Placido Sou, PA-C   MDM Rules/Calculators/A&P                          Pt is a 49 y.o. male that present with a history, physical exam, ED Clinical Course as noted above.   Patient initially presented due to fatigue, polyuria, polydipsia.  He was found to be significantly hyperglycemic.  No anion gap.  Doubt DKA at this time.  He was given 2 L of IV fluids.  Pt was initially hyperkalemic at 5.7 which came down to 5.5 after IVF. It was noted that the specimen was slightly hemolyzed and was a lipemic specimen. A lipid panel was obtained showing significantly elevated triglycerides at 723, total cholesterol of 217, HDL of 29, direct LDL of 64.7.  Pt states he is feeling much better at this time and requests to go home. He  appears stable for d/c at this time. Will d/c with a new rx for lipitor. Pt is planning on starting this medication today. Also I recommended that he double his dose of metformin which he is currently only taking once per day.  Pt is planning on following up with his PCP on Monday. He understands he needs to return to the ED with any new or worsening sx.  His questions were answered and he was amicable at the time of d/c. His VSS.  Patient discharged to home/self care.  Condition at discharge: Stable  Note: Portions of this report may have been transcribed using voice recognition software. Every effort was made to ensure accuracy; however, inadvertent computerized transcription errors may be present.    Final Clinical Impression(s) / ED Diagnoses Final diagnoses:  Hyperglycemia due to diabetes mellitus (HCC)  Hypertriglyceridemia  Hyperlipidemia with low HDL   Rx / DC Orders ED Discharge Orders         Ordered    atorvastatin (LIPITOR) 10 MG tablet  Daily     Discontinue  Reprint     03/10/20 1435           Placido Sou, PA-C 03/10/20 1516    Jacalyn Lefevre, MD 03/10/20 1530

## 2020-03-10 NOTE — ED Triage Notes (Signed)
Patient complains of ongoing weakness and states that I think I am still dehydrated. Patient denies pain, alert and oriented. Drinks large amounts of water daily

## 2020-03-10 NOTE — ED Notes (Addendum)
Patient verbalizes understanding of discharge instructions. Opportunity for questioning and answers were provided. Armband removed by staff, pt discharged from ED   Emphasized adhering to Lipitor Metformin

## 2020-03-10 NOTE — Discharge Instructions (Addendum)
Due to your high triglycerides, I have prescribed you Lipitor.  You can pick this up at your pharmacy.  You should start taking this today.  Per our discussion, start taking your Metformin twice a day now.  Please call your PCP first thing on Monday to discuss your dosing of this medication as well as your Lipitor.  Please return to the emergency department with any new or worsening symptoms.  It was a pleasure to meet you.

## 2020-03-10 NOTE — ED Notes (Signed)
CBG 429. Notified Asher Muir, Charity fundraiser.

## 2020-04-20 ENCOUNTER — Other Ambulatory Visit: Payer: Self-pay

## 2020-04-20 ENCOUNTER — Encounter: Payer: Self-pay | Admitting: Family Medicine

## 2020-04-20 ENCOUNTER — Ambulatory Visit: Payer: 59 | Admitting: Family Medicine

## 2020-04-20 VITALS — BP 135/89 | HR 98 | Temp 98.0°F | Ht 72.0 in | Wt 164.0 lb

## 2020-04-20 DIAGNOSIS — Z23 Encounter for immunization: Secondary | ICD-10-CM

## 2020-04-20 DIAGNOSIS — E782 Mixed hyperlipidemia: Secondary | ICD-10-CM | POA: Diagnosis not present

## 2020-04-20 DIAGNOSIS — E1110 Type 2 diabetes mellitus with ketoacidosis without coma: Secondary | ICD-10-CM

## 2020-04-20 DIAGNOSIS — E119 Type 2 diabetes mellitus without complications: Secondary | ICD-10-CM

## 2020-04-20 LAB — COMPREHENSIVE METABOLIC PANEL
ALT: 16 IU/L (ref 0–44)
AST: 15 IU/L (ref 0–40)
Albumin/Globulin Ratio: 1.7 (ref 1.2–2.2)
Albumin: 4.7 g/dL (ref 4.0–5.0)
Alkaline Phosphatase: 81 IU/L (ref 48–121)
BUN/Creatinine Ratio: 12 (ref 9–20)
BUN: 13 mg/dL (ref 6–24)
Bilirubin Total: 1 mg/dL (ref 0.0–1.2)
CO2: 23 mmol/L (ref 20–29)
Calcium: 10.3 mg/dL — ABNORMAL HIGH (ref 8.7–10.2)
Chloride: 99 mmol/L (ref 96–106)
Creatinine, Ser: 1.08 mg/dL (ref 0.76–1.27)
GFR calc Af Amer: 94 mL/min/{1.73_m2} (ref >59)
GFR calc non Af Amer: 81 mL/min/{1.73_m2} (ref >59)
Globulin, Total: 2.7 g/dL (ref 1.5–4.5)
Glucose: 445 mg/dL — ABNORMAL HIGH (ref 65–99)
Potassium: 4.7 mmol/L (ref 3.5–5.2)
Sodium: 139 mmol/L (ref 134–144)
Total Protein: 7.4 g/dL (ref 6.0–8.5)

## 2020-04-20 LAB — POCT GLYCOSYLATED HEMOGLOBIN (HGB A1C): Hemoglobin A1C: 14 % — AB (ref 4.0–5.6)

## 2020-04-20 MED ORDER — METFORMIN HCL 500 MG PO TABS: 500.0000 mg | ORAL_TABLET | Freq: Every day | ORAL | 3 refills | Status: DC

## 2020-04-20 MED ORDER — BLOOD GLUCOSE METER KIT: PACK | 0 refills | Status: DC

## 2020-04-20 NOTE — Progress Notes (Signed)
Patient ID: Dominic Hill, male    DOB: [birth year]  Age: 49 y.o. MRN: [id number]  Chief Complaint  Patient presents with   Diabetes    follow up/ recent hospital encounter for hyperglycemia    Subjective:   Patient was recently in the emergency room not feeling good.  He was in very poor control in his diabetes.  He is feeling much better now, taking his medication but he did run out again 3 days ago.  Current allergies, medications, problem list, past/family and social histories reviewed.  Objective:  BP 135/89    Pulse 98    Temp 98 F (36.7 C)    Ht 6' (1.829 m)    Wt 164 lb (74.4 kg)    SpO2 98%    BMI 22.24 kg/m   Alert and oriented.  Chest clear.  Heart rate without murmurs.  Fundi benign.  Neck supple without nodes or thyromegaly.  Assessment & Plan:   Assessment: 1. DM (diabetes mellitus) type 2, uncontrolled, with ketoacidosis ([place])   2. Controlled type 2 diabetes mellitus without complication, without long-term current use of insulin ([place])   3. Immunization due   4. Mixed hyperlipidemia       Plan: See instructions   Orders Placed This Encounter  Procedures   Tdap vaccine greater than or equal to 7yo IM   Microalbumin / creatinine urine ratio   Comprehensive metabolic panel   Ambulatory referral to Ophthalmology    Referral Priority:   Routine    Referral Type:   Consultation    Referral Reason:   Specialty Services Required    Requested Specialty:   Ophthalmology    Number of Visits Requested:   1   POCT glycosylated hemoglobin (Hb A1C)   HM Diabetes Foot Exam    Meds ordered this encounter  Medications   metFORMIN (GLUCOPHAGE) 500 MG tablet    Sig: Take 1 tablet (500 mg total) by mouth daily with breakfast.    Dispense:  360 tablet    Refill:  3   blood glucose meter kit and supplies    Sig: Dispense based on patient and insurance preference. Use up to four times daily as directed. (FOR ICD-10 E10.9, E11.9).    Dispense:  1 each     Refill:  0    Order Specific Question:   Number of strips    Answer:   200    Order Specific Question:   Number of lancets    Answer:   200         Patient Instructions   Your diabetes has gone from being in fairly good control with a hemoglobin A1c of 7.5 in February 2 terrible control with your hemoglobin of 14 now.  The hemoglobin A1c tells what the average blood sugar has been doing in the last 3 months.  Somebody who is nondiabetic is usually less than 5.6.  A diabetic usually has it above 6.5.  Our goal is to keep it between about 6.5 and 7.5 in a man your age.  You must take your medicine very faithfully.  You must monitor your own blood sugars and keep a record of them so you can discuss them with your doctor.  It is desirable to have morning blood sugars less than 150 before you have eaten (100-150 is good).  We do not want your sugar to run too low.  When it gets less than 80 or so you are in danger of getting lightheaded and  hypoglycemic.  If your blood sugar is falling too low you can pass out or get lightheaded.  You may break out in a sweat.  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  On the computer Internet look up the American Diabetes Association website, diabetes.org.  There is a lot of good information at that site on diet, exercise, medication, sugar control, etc.  Read up on type 2 diabetes.  Continue taking your Metformin 500 mg 2 pills twice daily, 2 at breakfast and 2 at dinner.  Try and check your blood sugar in the morning before eating and again 2 hours after your main meal in the evening.  Record those sugars and show them to your doctor.  Please return in about 3 weeks so we can see what your sugars are running and decide whether we need to add an additional medication.  I cannot be your regular physician, so they need to try and line you up with one of the other providers  for a regular physician though I can see you one more time if they are unable to do so.   If you have lab work done today you will be contacted with your lab results within the next 2 weeks.  If you have not heard from [place] then please contact us. The fastest way to get your results is to register for My Chart.   IF you received an x-ray today, you will receive an invoice from [facility]. Please contact [facility] at [phone] with questions or concerns regarding your invoice.   IF you received labwork today, you will receive an invoice from [place]. Please contact [facility] at [phone] with questions or concerns regarding your invoice.   Our billing staff will not be able to assist you with questions regarding bills from these companies.  You will be contacted with the lab results as soon as they are available. The fastest way to get your results is to activate your My Chart account. Instructions are located on the last page of this paperwork. If you have not heard from [place] regarding the results in 2 weeks, please contact this office.         Return in about 3 weeks (around 05/11/2020), or pcp or Irean Kendricks, for diabetes.   Ruben Reason, MD 04/20/2020

## 2020-04-20 NOTE — Patient Instructions (Addendum)
Your diabetes has gone from being in fairly good control with a hemoglobin A1c of 7.5 in February 2 terrible control with your hemoglobin of 14 now.  The hemoglobin A1c tells what the average blood sugar has been doing in the last 3 months.  Somebody who is nondiabetic is usually less than 5.6.  A diabetic usually has it above 6.5.  Our goal is to keep it between about 6.5 and 7.5 in a man your age.  You must take your medicine very faithfully.  You must monitor your own blood sugars and keep a record of them so you can discuss them with your doctor.  It is desirable to have morning blood sugars less than 150 before you have eaten (100-150 is good).  We do not want your sugar to run too low.  When it gets less than 80 or so you are in danger of getting lightheaded and hypoglycemic.  If your blood sugar is falling too low you can pass out or get lightheaded.  You may break out in a sweat.  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  On the computer Internet look up the American Diabetes Association website, diabetes.org.  There is a lot of good information at that site on diet, exercise, medication, sugar control, etc.  Read up on type 2 diabetes.  Continue taking your Metformin 500 mg 2 pills twice daily, 2 at breakfast and 2 at dinner.  Try and check your blood sugar in the morning before eating and again 2 hours after your main meal in the evening.  Record those sugars and show them to your doctor.  Please return in about 3 weeks so we can see what your sugars are running and decide whether we need to add an additional medication.  I cannot be your regular physician, so they need to try and line you up with one of the other providers for a regular physician though I can see you one more time if they are unable to do so.   If you have lab work done today you will be contacted with your lab results within the next  2 weeks.  If you have not heard from [place] then please contact us. The fastest way to get your results is to register for My Chart.   IF you received an x-ray today, you will receive an invoice from [facility]. Please contact [facility] at [phone] with questions or concerns regarding your invoice.   IF you received labwork today, you will receive an invoice from [place]. Please contact [facility] at [phone] with questions or concerns regarding your invoice.   Our billing staff will not be able to assist you with questions regarding bills from these companies.  You will be contacted with the lab results as soon as they are available. The fastest way to get your results is to activate your My Chart account. Instructions are located on the last page of this paperwork. If you have not heard from [place] regarding the results in 2 weeks, please contact this office.

## 2020-04-21 LAB — MICROALBUMIN / CREATININE URINE RATIO
Creatinine, Urine: 24.4 mg/dL
Microalb/Creat Ratio: 30 mg/g creat — ABNORMAL HIGH (ref 0–29)
Microalbumin, Urine: 7.2 ug/mL

## 2020-05-10 ENCOUNTER — Telehealth: Payer: Self-pay | Admitting: General Practice

## 2020-05-10 NOTE — Telephone Encounter (Signed)
Paitent is calling to reschedule his appt for 05/11/20 at 8:00a CB- [phone]

## 2020-05-11 ENCOUNTER — Ambulatory Visit: Payer: 59 | Admitting: Family Medicine

## 2020-05-11 NOTE — Telephone Encounter (Signed)
LVMTCB to resch appt that was suppose to be on 05/11/20

## 2020-05-11 NOTE — Telephone Encounter (Signed)
Please reschedule patient appt.

## 2020-06-22 ENCOUNTER — Encounter: Payer: Self-pay | Admitting: Registered Nurse

## 2020-06-22 ENCOUNTER — Ambulatory Visit: Payer: 59 | Admitting: Registered Nurse

## 2020-06-22 ENCOUNTER — Other Ambulatory Visit: Payer: Self-pay

## 2020-06-22 VITALS — BP 132/80 | HR 68 | Temp 98.1°F | Resp 18 | Ht 72.0 in | Wt 163.4 lb

## 2020-06-22 DIAGNOSIS — E1110 Type 2 diabetes mellitus with ketoacidosis without coma: Secondary | ICD-10-CM

## 2020-06-22 MED ORDER — PRAVASTATIN SODIUM 20 MG PO TABS: 20.0000 mg | ORAL_TABLET | Freq: Every day | ORAL | 3 refills | Status: DC

## 2020-06-22 NOTE — Progress Notes (Signed)
Established Patient Office Visit  Subjective:  Patient ID: Dominic Hill, male    DOB: [birth year]  Age: 49 y.o. MRN: [id number]  CC:  Chief Complaint  Patient presents with  . Establish Care    pt doing well TOC today [place] pt, pt does report his Atorvastatin has been causing some blurred vision and he would like something else in place of this otherwise no concerns     HPI Dominic Hill presents for visit to est care.  Hx of htn and t2dm  Htn: controlled by diet and activity. Has not been taking HCTZ any longer. BP wnl today. No CV symptoms at this time, feeling well.  T2DM: taking metformin 564m PO qd. Tolerates well. No AEs. Tries to comply with diet and activity. Last a1c was in Aug with Dr. [name] unfortunately had jumped from 7.5 to 14.0. will recheck today. Pt amenable to changing medication if warranted  Concerned today for AEs with atorvastatin 229mPO qd. Had been taking for hyperlipidemia, with notable result for triglycerides at 723. Unfortunately had been having blurred vision after taking atorvastatin, has stopped taking it at this time. Interested in alternatives. No ongoing symptoms after stopping atorvastatin.   No further concerns or complaints at this time.   Past Medical History:  Diagnosis Date  . Diabetes mellitus without complication ([place]  . Hypertension     History reviewed. No pertinent surgical history.  Family History  Problem Relation Age of Onset  . Hypertension Sister     Social History   Socioeconomic History  . Marital status: Married    Spouse name: Not on file  . Number of children: Not on file  . Years of education: Not on file  . Highest education level: Not on file  Occupational History  . Occupation: truck drGeophysicist/field seismologistTobacco Use  . Smoking status: Never Smoker  . Smokeless tobacco: Never Used  Vaping Use  . Vaping Use: Never used  Substance and Sexual Activity  . Alcohol use: No    Alcohol/week: 0.0 standard drinks  .  Drug use: No  . Sexual activity: Yes    Partners: Female  Other Topics Concern  . Not on file  Social History Narrative   Lives with wife,   [place]   Social Determinants of Health   Financial Resource Strain:   . Difficulty of Paying Living Expenses: Not on file  Food Insecurity:   . Worried About RuCharity fundraisern the Last Year: Not on file  . Ran Out of Food in the Last Year: Not on file  Transportation Needs:   . Lack of Transportation (Medical): Not on file  . Lack of Transportation (Non-Medical): Not on file  Physical Activity:   . Days of Exercise per Week: Not on file  . Minutes of Exercise per Session: Not on file  Stress:   . Feeling of Stress : Not on file  Social Connections:   . Frequency of Communication with Friends and Family: Not on file  . Frequency of Social Gatherings with Friends and Family: Not on file  . Attends Religious Services: Not on file  . Active Member of Clubs or Organizations: Not on file  . Attends ClArchivisteetings: Not on file  . Marital Status: Not on file  Intimate Partner Violence:   . Fear of Current or Ex-Partner: Not on file  . Emotionally Abused: Not on file  . Physically Abused:  Not on file  . Sexually Abused: Not on file    Outpatient Medications Prior to Visit  Medication Sig Dispense Refill  . blood glucose meter kit and supplies Dispense based on patient and insurance preference. Use up to four times daily as directed. (FOR ICD-10 E10.9, E11.9). 1 each 0  . metFORMIN (GLUCOPHAGE) 500 MG tablet Take 1 tablet (500 mg total) by mouth daily with breakfast. 360 tablet 3  . atorvastatin (LIPITOR) 10 MG tablet Take 2 tablets (20 mg total) by mouth daily. 120 tablet 0  . hydrochlorothiazide (HYDRODIURIL) 25 MG tablet Take 1 tablet (25 mg total) by mouth daily. 30 tablet 3   No facility-administered medications prior to visit.    No Known Allergies  ROS Review of Systems    Constitutional: Negative.   HENT: Negative.   Eyes: Negative.   Respiratory: Negative.   Cardiovascular: Negative.   Gastrointestinal: Negative.   Genitourinary: Negative.   Musculoskeletal: Negative.   Skin: Negative.   Neurological: Negative.   Psychiatric/Behavioral: Negative.   All other systems reviewed and are negative.     Objective:    Physical Exam Constitutional:      General: He is not in acute distress.    Appearance: Normal appearance. He is normal weight. He is not ill-appearing, toxic-appearing or diaphoretic.  Cardiovascular:     Rate and Rhythm: Normal rate and regular rhythm.     Heart sounds: Normal heart sounds. No murmur heard.  No friction rub. No gallop.   Pulmonary:     Effort: Pulmonary effort is normal. No respiratory distress.     Breath sounds: Normal breath sounds. No stridor. No wheezing, rhonchi or rales.  Chest:     Chest wall: No tenderness.  Neurological:     General: No focal deficit present.     Mental Status: He is alert and oriented to person, place, and time. Mental status is at baseline.  Psychiatric:        Mood and Affect: Mood normal.        Behavior: Behavior normal.        Thought Content: Thought content normal.        Judgment: Judgment normal.     BP 132/80   Pulse 68   Temp 98.1 F (36.7 C) (Temporal)   Resp 18   Ht 6' (1.829 m)   Wt 163 lb 6.4 oz (74.1 kg)   SpO2 99%   BMI 22.16 kg/m  Wt Readings from Last 3 Encounters:  06/22/20 163 lb 6.4 oz (74.1 kg)  04/20/20 164 lb (74.4 kg)  03/10/20 172 lb (78 kg)     Health Maintenance Due  Topic Date Due  . PNEUMOCOCCAL POLYSACCHARIDE VACCINE AGE 47-64 HIGH RISK  Never done    There are no preventive care reminders to display for this patient.  Lab Results  Component Value Date   TSH 1.610 10/14/2019   Lab Results  Component Value Date   WBC 4.9 03/10/2020   HGB 13.6 03/10/2020   HCT 40.0 03/10/2020   MCV 85.1 03/10/2020   PLT 196 03/10/2020   Lab  Results  Component Value Date   NA 139 04/20/2020   K 4.7 04/20/2020   CO2 23 04/20/2020   GLUCOSE 445 (H) 04/20/2020   BUN 13 04/20/2020   CREATININE 1.08 04/20/2020   BILITOT 1.0 04/20/2020   ALKPHOS 81 04/20/2020   AST 15 04/20/2020   ALT 16 04/20/2020   PROT 7.4 04/20/2020   ALBUMIN 4.7  04/20/2020   CALCIUM 10.3 (H) 04/20/2020   ANIONGAP 13 03/10/2020   Lab Results  Component Value Date   CHOL 217 (H) 03/10/2020   Lab Results  Component Value Date   HDL 29 (L) 03/10/2020   Lab Results  Component Value Date   LDLCALC UNABLE TO CALCULATE IF TRIGLYCERIDE OVER 400 mg/dL 03/10/2020   Lab Results  Component Value Date   TRIG 723 (H) 03/10/2020   Lab Results  Component Value Date   CHOLHDL 7.5 03/10/2020   Lab Results  Component Value Date   HGBA1C 14.0 (A) 04/20/2020      Assessment & Plan:   Problem List Items Addressed This Visit    None    Visit Diagnoses    DM (diabetes mellitus) type 2, uncontrolled, with ketoacidosis ([place])    -  Primary   Relevant Medications   pravastatin (PRAVACHOL) 20 MG tablet   Other Relevant Orders   Lipid panel   POCT glycosylated hemoglobin (Hb A1C)   Comprehensive metabolic panel      Meds ordered this encounter  Medications  . pravastatin (PRAVACHOL) 20 MG tablet    Sig: Take 1 tablet (20 mg total) by mouth daily.    Dispense:  90 tablet    Refill:  3    Order Specific Question:   Supervising Provider    Answer:   Carlota Raspberry, JEFFREY R [[id number]]    Follow-up: Return in about 3 months (around 09/22/2020) for t2dm check - please book around 09/11/19.   PLAN  Start pravastatin. Lower side effect profile. If any AEs, stop and contact clinic. May consider alternatives like Welchol if needed.  Labs collected, will follow up as warranted. Will likely need to initiate alternative t2dm treatment - pt may be good candidate for trulicity or farxiga  Patient encouraged to call clinic with any questions, comments, or  concerns.  Maximiano Coss, NP

## 2020-06-22 NOTE — Patient Instructions (Signed)
° ° ° °  If you have lab work done today you will be contacted with your lab results within the next 2 weeks.  If you have not heard from us then please contact us. The fastest way to get your results is to register for My Chart. ° ° °IF you received an x-ray today, you will receive an invoice from [facility]. Please contact [facility] at [phone] with questions or concerns regarding your invoice.  ° °IF you received labwork today, you will receive an invoice from [facility]. Please contact [facility] at [phone] with questions or concerns regarding your invoice.  ° °Our billing staff will not be able to assist you with questions regarding bills from these companies. ° °You will be contacted with the lab results as soon as they are available. The fastest way to get your results is to activate your My Chart account. Instructions are located on the last page of this paperwork. If you have not heard from us regarding the results in 2 weeks, please contact this office. °  ° ° ° °

## 2020-06-23 LAB — COMPREHENSIVE METABOLIC PANEL
ALT: 13 IU/L (ref 0–44)
AST: 12 IU/L (ref 0–40)
Albumin/Globulin Ratio: 1.7 (ref 1.2–2.2)
Albumin: 4.5 g/dL (ref 4.0–5.0)
Alkaline Phosphatase: 95 IU/L (ref 44–121)
BUN/Creatinine Ratio: 13 (ref 9–20)
BUN: 14 mg/dL (ref 6–24)
Bilirubin Total: 0.7 mg/dL (ref 0.0–1.2)
CO2: 23 mmol/L (ref 20–29)
Calcium: 10 mg/dL (ref 8.7–10.2)
Chloride: 99 mmol/L (ref 96–106)
Creatinine, Ser: 1.09 mg/dL (ref 0.76–1.27)
GFR calc Af Amer: 93 mL/min/{1.73_m2} (ref >59)
GFR calc non Af Amer: 80 mL/min/{1.73_m2} (ref >59)
Globulin, Total: 2.6 g/dL (ref 1.5–4.5)
Glucose: 509 mg/dL (ref 65–99)
Potassium: 4.8 mmol/L (ref 3.5–5.2)
Sodium: 137 mmol/L (ref 134–144)
Total Protein: 7.1 g/dL (ref 6.0–8.5)

## 2020-06-23 LAB — LIPID PANEL
Chol/HDL Ratio: 4.1 ratio (ref 0.0–5.0)
Cholesterol, Total: 167 mg/dL (ref 100–199)
HDL: 41 mg/dL (ref >39)
LDL Chol Calc (NIH): 79 mg/dL (ref 0–99)
Triglycerides: 285 mg/dL — ABNORMAL HIGH (ref 0–149)
VLDL Cholesterol Cal: 47 mg/dL — ABNORMAL HIGH (ref 5–40)

## 2020-06-25 ENCOUNTER — Telehealth: Payer: Self-pay

## 2020-06-25 ENCOUNTER — Other Ambulatory Visit: Payer: Self-pay | Admitting: Registered Nurse

## 2020-06-25 DIAGNOSIS — E1110 Type 2 diabetes mellitus with ketoacidosis without coma: Secondary | ICD-10-CM

## 2020-06-25 LAB — HEMOGLOBIN A1C
Est. average glucose Bld gHb Est-mCnc: >398 mg/dL
Hgb A1c MFr Bld: >15.5 % — ABNORMAL HIGH (ref 4.8–5.6)

## 2020-06-25 MED ORDER — METFORMIN HCL 1000 MG PO TABS: 1000.0000 mg | ORAL_TABLET | Freq: Two times a day (BID) | ORAL | 3 refills | Status: DC

## 2020-06-25 MED ORDER — DULAGLUTIDE 0.75 MG/0.5ML ~~LOC~~ SOAJ: 0.7500 mg | SUBCUTANEOUS | 0 refills | Status: DC

## 2020-06-25 NOTE — Telephone Encounter (Signed)
Clydie Braun with [facility] calling with critical glucose from 06/22/20 - 509. Felicia and lab notified.

## 2020-06-25 NOTE — Telephone Encounter (Signed)
Call with critical lab reported on pt. Glucose of 509

## 2020-09-10 ENCOUNTER — Other Ambulatory Visit: Payer: Self-pay

## 2020-09-10 ENCOUNTER — Ambulatory Visit: Payer: 59 | Admitting: Registered Nurse

## 2020-09-10 ENCOUNTER — Encounter: Payer: Self-pay | Admitting: Registered Nurse

## 2020-09-10 VITALS — BP 138/85 | HR 81 | Temp 98.2°F | Resp 16 | Ht 72.0 in | Wt 164.0 lb

## 2020-09-10 DIAGNOSIS — E1110 Type 2 diabetes mellitus with ketoacidosis without coma: Secondary | ICD-10-CM

## 2020-09-10 LAB — POCT GLYCOSYLATED HEMOGLOBIN (HGB A1C): Hemoglobin A1C: 14 % — AB (ref 4.0–5.6)

## 2020-09-10 LAB — GLUCOSE, POCT (MANUAL RESULT ENTRY)

## 2020-09-10 MED ORDER — DULAGLUTIDE 1.5 MG/0.5ML ~~LOC~~ SOAJ: 1.5000 mg | SUBCUTANEOUS | 0 refills | Status: DC

## 2020-09-10 MED ORDER — INSULIN DETEMIR 100 UNIT/ML FLEXPEN: 20.0000 [IU] | PEN_INJECTOR | Freq: Every day | SUBCUTANEOUS | 0 refills | Status: DC

## 2020-09-10 MED ORDER — DAPAGLIFLOZIN PROPANEDIOL 10 MG PO TABS: 10.0000 mg | ORAL_TABLET | Freq: Every day | ORAL | 0 refills | Status: DC

## 2020-09-10 MED ORDER — METFORMIN HCL 1000 MG PO TABS: 1000.0000 mg | ORAL_TABLET | Freq: Two times a day (BID) | ORAL | 0 refills | Status: DC

## 2020-09-10 NOTE — Progress Notes (Signed)
Established Patient Office Visit  Subjective:  Patient ID: Markevious Ehmke, male    DOB: 1971/02/18  Age: 50 y.o. MRN: [id number]  CC:  Chief Complaint  Patient presents with  . Diabetes    Pt reports checks sugar sometimes reports last was 1 week ago and was 250 at the time     HPI Makale Zavadil presents for t2dm follow up   Last A1c:  Lab Results  Component Value Date   HGBA1C >15.5 (H) 06/22/2020    Currently taking: dulaglutide 0.24m/0.5mL sub q weekly, metformin 10062mPO bid ac. No new complications Reports good compliance with medications Diet has been improving Exercise habits have been steady   Past Medical History:  Diagnosis Date  . Diabetes mellitus without complication ([place]  . Hypertension     No past surgical history on file.  Family History  Problem Relation Age of Onset  . Hypertension Sister     Social History   Socioeconomic History  . Marital status: Married    Spouse name: Not on file  . Number of children: Not on file  . Years of education: Not on file  . Highest education level: Not on file  Occupational History  . Occupation: truck drGeophysicist/field seismologistTobacco Use  . Smoking status: Never Smoker  . Smokeless tobacco: Never Used  Vaping Use  . Vaping Use: Never used  Substance and Sexual Activity  . Alcohol use: No    Alcohol/week: 0.0 standard drinks  . Drug use: No  . Sexual activity: Yes    Partners: Female  Other Topics Concern  . Not on file  Social History Narrative   Lives with wife,   [place]   Social Determinants of Health   Financial Resource Strain: Not on fiComcastnsecurity: Not on file  Transportation Needs: Not on file  Physical Activity: Not on file  Stress: Not on file  Social Connections: Not on file  Intimate Partner Violence: Not on file    Outpatient Medications Prior to Visit  Medication Sig Dispense Refill  . blood glucose meter kit and supplies Dispense based on patient and  insurance preference. Use up to four times daily as directed. (FOR ICD-10 E10.9, E11.9). 1 each 0  . Dulaglutide 0.75 MG/0.5ML SOPN Inject 0.75 mg into the skin once a week. 6 mL 0  . metFORMIN (GLUCOPHAGE) 1000 MG tablet Take 1 tablet (1,000 mg total) by mouth 2 (two) times daily with a meal. 180 tablet 3  . pravastatin (PRAVACHOL) 20 MG tablet Take 1 tablet (20 mg total) by mouth daily. (Patient not taking: Reported on 09/10/2020) 90 tablet 3   No facility-administered medications prior to visit.    No Known Allergies  ROS Review of Systems  Constitutional: Negative.   HENT: Negative.   Eyes: Negative.   Respiratory: Negative.   Cardiovascular: Negative.   Gastrointestinal: Negative.   Genitourinary: Negative.   Musculoskeletal: Negative.   Skin: Negative.   Neurological: Negative.   Psychiatric/Behavioral: Negative.   All other systems reviewed and are negative.     Objective:    Physical Exam Constitutional:      General: He is not in acute distress.    Appearance: Normal appearance. He is normal weight. He is not ill-appearing, toxic-appearing or diaphoretic.  Cardiovascular:     Rate and Rhythm: Normal rate and regular rhythm.     Heart sounds: Normal heart sounds. No murmur heard. No friction rub.  No gallop.   Pulmonary:     Effort: Pulmonary effort is normal. No respiratory distress.     Breath sounds: Normal breath sounds. No stridor. No wheezing, rhonchi or rales.  Chest:     Chest wall: No tenderness.  Neurological:     General: No focal deficit present.     Mental Status: He is alert and oriented to person, place, and time. Mental status is at baseline.  Psychiatric:        Mood and Affect: Mood normal.        Behavior: Behavior normal.        Thought Content: Thought content normal.        Judgment: Judgment normal.     BP 138/85   Pulse 81   Temp 98.2 F (36.8 C) (Temporal)   Resp 16   Ht 6' (1.829 m)   Wt 164 lb (74.4 kg)   SpO2 98%   BMI  22.24 kg/m  Wt Readings from Last 3 Encounters:  09/10/20 164 lb (74.4 kg)  06/22/20 163 lb 6.4 oz (74.1 kg)  04/20/20 164 lb (74.4 kg)     Health Maintenance Due  Topic Date Due  . COLONOSCOPY (Pts 45-37yr Insurance coverage will need to be confirmed)  Never done    There are no preventive care reminders to display for this patient.  Lab Results  Component Value Date   TSH 1.610 10/14/2019   Lab Results  Component Value Date   WBC 4.9 03/10/2020   HGB 13.6 03/10/2020   HCT 40.0 03/10/2020   MCV 85.1 03/10/2020   PLT 196 03/10/2020   Lab Results  Component Value Date   NA 137 06/22/2020   K 4.8 06/22/2020   CO2 23 06/22/2020   GLUCOSE 509 (HH) 06/22/2020   BUN 14 06/22/2020   CREATININE 1.09 06/22/2020   BILITOT 0.7 06/22/2020   ALKPHOS 95 06/22/2020   AST 12 06/22/2020   ALT 13 06/22/2020   PROT 7.1 06/22/2020   ALBUMIN 4.5 06/22/2020   CALCIUM 10.0 06/22/2020   ANIONGAP 13 03/10/2020   Lab Results  Component Value Date   CHOL 167 06/22/2020   Lab Results  Component Value Date   HDL 41 06/22/2020   Lab Results  Component Value Date   LDLCALC 79 06/22/2020   Lab Results  Component Value Date   TRIG 285 (H) 06/22/2020   Lab Results  Component Value Date   CHOLHDL 4.1 06/22/2020   Lab Results  Component Value Date   HGBA1C >15.5 (H) 06/22/2020      Assessment & Plan:   Problem List Items Addressed This Visit   None   Visit Diagnoses    DM (diabetes mellitus) type 2, uncontrolled, with ketoacidosis ([place]    -  Primary   Relevant Medications   Dulaglutide 1.5 MG/0.5ML SOPN   metFORMIN (GLUCOPHAGE) 1000 MG tablet   dapagliflozin propanediol (FARXIGA) 10 MG TABS tablet   Other Relevant Orders   POCT glycosylated hemoglobin (Hb A1C)   POCT glucose (manual entry)      Meds ordered this encounter  Medications  . Dulaglutide 1.5 MG/0.5ML SOPN    Sig: Inject 1.5 mg into the skin once a week.    Dispense:  6 mL    Refill:  0    Order  Specific Question:   Supervising Provider    Answer:   GCarlota Raspberry JEFFREY R [[id number]]  . metFORMIN (GLUCOPHAGE) 1000 MG tablet    Sig: Take 1 tablet (  1,000 mg total) by mouth 2 (two) times daily with a meal.    Dispense:  180 tablet    Refill:  0    Order Specific Question:   Supervising Provider    Answer:   Carlota Raspberry, JEFFREY R [[id number]]  . dapagliflozin propanediol (FARXIGA) 10 MG TABS tablet    Sig: Take 1 tablet (10 mg total) by mouth daily before breakfast.    Dispense:  90 tablet    Refill:  0    Order Specific Question:   Supervising Provider    Answer:   Carlota Raspberry, JEFFREY R [[id number]]    Follow-up: No follow-ups on file.   PLAN  a1c today is   Plan to add farxiga, increase trulicity  Start insulin daily  Return in 3 mo  Patient encouraged to call clinic with any questions, comments, or concerns.  Maximiano Coss, NP

## 2020-09-10 NOTE — Patient Instructions (Signed)
° ° ° °  If you have lab work done today you will be contacted with your lab results within the next 2 weeks.  If you have not heard from us then please contact us. The fastest way to get your results is to register for My Chart. ° ° °IF you received an x-ray today, you will receive an invoice from [facility]. Please contact [facility] at [phone] with questions or concerns regarding your invoice.  ° °IF you received labwork today, you will receive an invoice from [facility]. Please contact [facility] at [phone] with questions or concerns regarding your invoice.  ° °Our billing staff will not be able to assist you with questions regarding bills from these companies. ° °You will be contacted with the lab results as soon as they are available. The fastest way to get your results is to activate your My Chart account. Instructions are located on the last page of this paperwork. If you have not heard from us regarding the results in 2 weeks, please contact this office. °  ° ° ° °

## 2020-11-09 ENCOUNTER — Encounter: Payer: Self-pay | Admitting: Registered Nurse

## 2020-11-09 ENCOUNTER — Ambulatory Visit: Payer: 59 | Admitting: Registered Nurse

## 2020-11-09 ENCOUNTER — Other Ambulatory Visit: Payer: Self-pay

## 2020-11-09 VITALS — BP 171/90 | HR 72 | Temp 98.0°F | Resp 18 | Ht 72.0 in | Wt 184.0 lb

## 2020-11-09 DIAGNOSIS — E1110 Type 2 diabetes mellitus with ketoacidosis without coma: Secondary | ICD-10-CM | POA: Diagnosis not present

## 2020-11-09 LAB — POCT GLYCOSYLATED HEMOGLOBIN (HGB A1C): Hemoglobin A1C: 10.5 % — AB (ref 4.0–5.6)

## 2020-11-09 MED ORDER — TRULICITY 3 MG/0.5ML ~~LOC~~ SOAJ: 3.0000 mg | SUBCUTANEOUS | 0 refills | Status: DC

## 2020-11-09 MED ORDER — METFORMIN HCL 1000 MG PO TABS: 1000.0000 mg | ORAL_TABLET | Freq: Two times a day (BID) | ORAL | 0 refills | Status: DC

## 2020-11-09 MED ORDER — INSULIN DETEMIR 100 UNIT/ML FLEXPEN: 20.0000 [IU] | PEN_INJECTOR | Freq: Every day | SUBCUTANEOUS | 0 refills | Status: DC

## 2020-11-09 MED ORDER — DAPAGLIFLOZIN PROPANEDIOL 10 MG PO TABS: 10.0000 mg | ORAL_TABLET | Freq: Every day | ORAL | 0 refills | Status: DC

## 2020-11-09 NOTE — Progress Notes (Signed)
Established Patient Office Visit  Subjective:  Patient ID: Dominic Hill, male    DOB: [birth year]  Age: 50 y.o. MRN: [id number]  CC:  Chief Complaint  Patient presents with  . Follow-up    Patient states he is here for 2 month follow up on his diabetes    HPI Dominic Hill presents for t2dm  Last A1c:  Lab Results  Component Value Date   HGBA1C 14.0 (A) 09/10/2020    Currently taking: levemir 20 units daily, farxiga 48GN daily, trulicity 0.0BB weekly, metformin 1061m PO bid ac. No new complications Reports good compliance with medications Diet has been steady Exercise habits have been improved.   Past Medical History:  Diagnosis Date  . Diabetes mellitus without complication ([place]   . Hypertension     No past surgical history on file.  Family History  Problem Relation Age of Onset  . Hypertension Sister     Social History   Socioeconomic History  . Marital status: Married    Spouse name: Not on file  . Number of children: Not on file  . Years of education: Not on file  . Highest education level: Not on file  Occupational History  . Occupation: truck dGeophysicist/field seismologist Tobacco Use  . Smoking status: Never Smoker  . Smokeless tobacco: Never Used  Vaping Use  . Vaping Use: Never used  Substance and Sexual Activity  . Alcohol use: No    Alcohol/week: 0.0 standard drinks  . Drug use: No  . Sexual activity: Yes    Partners: Female  Other Topics Concern  . Not on file  Social History Narrative   Lives with wife,   [place]   Social Determinants of Health   Financial Resource Strain: Not on fComcastInsecurity: Not on file  Transportation Needs: Not on file  Physical Activity: Not on file  Stress: Not on file  Social Connections: Not on file  Intimate Partner Violence: Not on file    Outpatient Medications Prior to Visit  Medication Sig Dispense Refill  . blood glucose meter kit and supplies Dispense based on patient and  insurance preference. Use up to four times daily as directed. (FOR ICD-10 E10.9, E11.9). 1 each 0  . dapagliflozin propanediol (FARXIGA) 10 MG TABS tablet Take 1 tablet (10 mg total) by mouth daily before breakfast. 90 tablet 0  . Dulaglutide 1.5 MG/0.5ML SOPN Inject 1.5 mg into the skin once a week. 6 mL 0  . insulin detemir (LEVEMIR) 100 UNIT/ML FlexPen Inject 20 Units into the skin daily. 18 mL 0  . metFORMIN (GLUCOPHAGE) 1000 MG tablet Take 1 tablet (1,000 mg total) by mouth 2 (two) times daily with a meal. 180 tablet 0  . pravastatin (PRAVACHOL) 20 MG tablet Take 1 tablet (20 mg total) by mouth daily. (Patient not taking: Reported on 11/09/2020) 90 tablet 3   No facility-administered medications prior to visit.    No Known Allergies  ROS Review of Systems  Constitutional: Negative.   HENT: Negative.   Eyes: Negative.   Respiratory: Negative.   Cardiovascular: Negative.   Gastrointestinal: Negative.   Genitourinary: Negative.   Musculoskeletal: Negative.   Skin: Negative.   Neurological: Negative.   Psychiatric/Behavioral: Negative.   All other systems reviewed and are negative.     Objective:    Physical Exam Constitutional:      General: He is not in acute distress.    Appearance: Normal appearance. He is  normal weight. He is not ill-appearing, toxic-appearing or diaphoretic.  Cardiovascular:     Rate and Rhythm: Normal rate and regular rhythm.     Heart sounds: Normal heart sounds. No murmur heard. No friction rub. No gallop.   Pulmonary:     Effort: Pulmonary effort is normal. No respiratory distress.     Breath sounds: Normal breath sounds. No stridor. No wheezing, rhonchi or rales.  Chest:     Chest wall: No tenderness.  Neurological:     General: No focal deficit present.     Mental Status: He is alert and oriented to person, place, and time. Mental status is at baseline.  Psychiatric:        Mood and Affect: Mood normal.        Behavior: Behavior normal.         Thought Content: Thought content normal.        Judgment: Judgment normal.     BP (!) 171/90   Pulse 72   Temp 98 F (36.7 C) (Temporal)   Resp 18   Ht 6' (1.829 m)   Wt 184 lb (83.5 kg)   SpO2 98%   BMI 24.95 kg/m  Wt Readings from Last 3 Encounters:  11/09/20 184 lb (83.5 kg)  09/10/20 164 lb (74.4 kg)  06/22/20 163 lb 6.4 oz (74.1 kg)     There are no preventive care reminders to display for this patient.  There are no preventive care reminders to display for this patient.  Lab Results  Component Value Date   TSH 1.610 10/14/2019   Lab Results  Component Value Date   WBC 4.9 03/10/2020   HGB 13.6 03/10/2020   HCT 40.0 03/10/2020   MCV 85.1 03/10/2020   PLT 196 03/10/2020   Lab Results  Component Value Date   NA 137 06/22/2020   K 4.8 06/22/2020   CO2 23 06/22/2020   GLUCOSE 509 (HH) 06/22/2020   BUN 14 06/22/2020   CREATININE 1.09 06/22/2020   BILITOT 0.7 06/22/2020   ALKPHOS 95 06/22/2020   AST 12 06/22/2020   ALT 13 06/22/2020   PROT 7.1 06/22/2020   ALBUMIN 4.5 06/22/2020   CALCIUM 10.0 06/22/2020   ANIONGAP 13 03/10/2020   Lab Results  Component Value Date   CHOL 167 06/22/2020   Lab Results  Component Value Date   HDL 41 06/22/2020   Lab Results  Component Value Date   LDLCALC 79 06/22/2020   Lab Results  Component Value Date   TRIG 285 (H) 06/22/2020   Lab Results  Component Value Date   CHOLHDL 4.1 06/22/2020   Lab Results  Component Value Date   HGBA1C 14.0 (A) 09/10/2020      Assessment & Plan:   Problem List Items Addressed This Visit   None   Visit Diagnoses    DM (diabetes mellitus) type 2, uncontrolled, with ketoacidosis ([place])    -  Primary   Relevant Orders   POCT glycosylated hemoglobin (Hb A1C)      No orders of the defined types were placed in this encounter.   Follow-up: No follow-ups on file.   PLAN  A1c today at 10.5, down from 14 on 09/10/20.   Continue current regimen with trulicity  increased to 3.53m, plan to reduce or cease levemir if we see further control before decreasing other medications.  Return in 3 mo  Patient encouraged to call clinic with any questions, comments, or concerns.  RMaximiano Coss NP

## 2020-11-09 NOTE — Patient Instructions (Signed)
° ° ° °  If you have lab work done today you will be contacted with your lab results within the next 2 weeks.  If you have not heard from us then please contact us. The fastest way to get your results is to register for My Chart. ° ° °IF you received an x-ray today, you will receive an invoice from [facility]. Please contact [facility] at [phone] with questions or concerns regarding your invoice.  ° °IF you received labwork today, you will receive an invoice from [facility]. Please contact [facility] at [phone] with questions or concerns regarding your invoice.  ° °Our billing staff will not be able to assist you with questions regarding bills from these companies. ° °You will be contacted with the lab results as soon as they are available. The fastest way to get your results is to activate your My Chart account. Instructions are located on the last page of this paperwork. If you have not heard from us regarding the results in 2 weeks, please contact this office. °  ° ° ° °

## 2021-02-05 ENCOUNTER — Other Ambulatory Visit: Payer: Self-pay

## 2021-02-05 ENCOUNTER — Emergency Department (HOSPITAL_COMMUNITY)
Admission: EM | Admit: 2021-02-05 | Discharge: 2021-02-05 | Disposition: A | Discharge status: Home / Self Care | Payer: 59 | Attending: Emergency Medicine | Admitting: Emergency Medicine

## 2021-02-05 ENCOUNTER — Emergency Department (HOSPITAL_COMMUNITY): Payer: 59

## 2021-02-05 DIAGNOSIS — E119 Type 2 diabetes mellitus without complications: Secondary | ICD-10-CM | POA: Insufficient documentation

## 2021-02-05 DIAGNOSIS — Z7984 Long term (current) use of oral hypoglycemic drugs: Secondary | ICD-10-CM | POA: Insufficient documentation

## 2021-02-05 DIAGNOSIS — I10 Essential (primary) hypertension: Secondary | ICD-10-CM | POA: Insufficient documentation

## 2021-02-05 DIAGNOSIS — Z794 Long term (current) use of insulin: Secondary | ICD-10-CM | POA: Insufficient documentation

## 2021-02-05 DIAGNOSIS — R0602 Shortness of breath: Secondary | ICD-10-CM | POA: Diagnosis not present

## 2021-02-05 DIAGNOSIS — R0789 Other chest pain: Secondary | ICD-10-CM | POA: Diagnosis present

## 2021-02-05 DIAGNOSIS — R079 Chest pain, unspecified: Secondary | ICD-10-CM

## 2021-02-05 DIAGNOSIS — R1013 Epigastric pain: Secondary | ICD-10-CM | POA: Insufficient documentation

## 2021-02-05 LAB — BASIC METABOLIC PANEL
Anion gap: 8 (ref 5–15)
BUN: 16 mg/dL (ref 6–20)
CO2: 26 mmol/L (ref 22–32)
Calcium: 9.3 mg/dL (ref 8.9–10.3)
Chloride: 99 mmol/L (ref 98–111)
Creatinine, Ser: 1.09 mg/dL (ref 0.61–1.24)
GFR, Estimated: >60 mL/min (ref >60)
Glucose, Bld: 619 mg/dL (ref 70–99)
Potassium: 4.4 mmol/L (ref 3.5–5.1)
Sodium: 133 mmol/L — ABNORMAL LOW (ref 135–145)

## 2021-02-05 LAB — HEPATIC FUNCTION PANEL
ALT: 19 U/L (ref 0–44)
AST: 21 U/L (ref 15–41)
Albumin: 4.3 g/dL (ref 3.5–5.0)
Alkaline Phosphatase: 64 U/L (ref 38–126)
Bilirubin, Direct: 0.2 mg/dL (ref 0.0–0.2)
Indirect Bilirubin: 0.7 mg/dL (ref 0.3–0.9)
Total Bilirubin: 0.9 mg/dL (ref 0.3–1.2)
Total Protein: 7.6 g/dL (ref 6.5–8.1)

## 2021-02-05 LAB — CBC
HCT: 43.3 % (ref 39.0–52.0)
Hemoglobin: 14.5 g/dL (ref 13.0–17.0)
MCH: 28.2 pg (ref 26.0–34.0)
MCHC: 33.5 g/dL (ref 30.0–36.0)
MCV: 84.1 fL (ref 80.0–100.0)
Platelets: 186 10*3/uL (ref 150–400)
RBC: 5.15 MIL/uL (ref 4.22–5.81)
RDW: 12.8 % (ref 11.5–15.5)
WBC: 4.8 10*3/uL (ref 4.0–10.5)
nRBC: 0 % (ref 0.0–0.2)

## 2021-02-05 LAB — TROPONIN I (HIGH SENSITIVITY)
Troponin I (High Sensitivity): 3 ng/L (ref <18)
Troponin I (High Sensitivity): 4 ng/L (ref <18)

## 2021-02-05 LAB — CBG MONITORING, ED: Glucose-Capillary: 368 mg/dL — ABNORMAL HIGH (ref 70–99)

## 2021-02-05 LAB — LIPASE, BLOOD: Lipase: 39 U/L (ref 11–51)

## 2021-02-05 MED ORDER — INSULIN ASPART 100 UNIT/ML IJ SOLN
10.0000 [IU] | Freq: Once | INTRAMUSCULAR | Status: AC
  Administered 2021-02-05: 10 [IU] via SUBCUTANEOUS

## 2021-02-05 MED ORDER — SUCRALFATE 1 G PO TABS: 1.0000 g | ORAL_TABLET | Freq: Three times a day (TID) | ORAL | 0 refills | Status: DC

## 2021-02-05 MED ORDER — LIDOCAINE VISCOUS HCL 2 % MT SOLN
15.0000 mL | Freq: Once | OROMUCOSAL | Status: AC
  Administered 2021-02-05: 15 mL via ORAL
  Filled 2021-02-05: qty 15

## 2021-02-05 MED ORDER — SODIUM CHLORIDE 0.9 % IV BOLUS
1000.0000 mL | Freq: Once | INTRAVENOUS | Status: AC
  Administered 2021-02-05: 1000 mL via INTRAVENOUS

## 2021-02-05 MED ORDER — ALUM & MAG HYDROXIDE-SIMETH 200-200-20 MG/5ML PO SUSP
30.0000 mL | Freq: Once | ORAL | Status: AC
  Administered 2021-02-05: 30 mL via ORAL
  Filled 2021-02-05: qty 30

## 2021-02-05 MED ORDER — OMEPRAZOLE 20 MG PO CPDR: 20.0000 mg | DELAYED_RELEASE_CAPSULE | Freq: Every day | ORAL | 0 refills | Status: DC

## 2021-02-05 NOTE — Discharge Instructions (Signed)
Overall suspect that your pain is from acid reflux.  Start medication prescribed to see if that helps with the symptoms.  However given your other cardiac risk factors we will have you follow-up with cardiology and your primary care doctor as well.  Follow-up with your primary care doctor to further discuss her diabetes management.

## 2021-02-05 NOTE — ED Provider Notes (Signed)
Emergency Medicine Provider Triage Evaluation Note  Dominic Hill , a 50 y.o. male  was evaluated in triage.  Pt complains of CP x 1 day. Sharp, midsternal, intermittent and lasts for 5 minutes at time. Started at rest, worse with exertion. Some SOB during episodes. No previous stroke, MI, or blood clots. HTN, high cholesterol   Review of Systems  Positive: Chest pain, SOB, nausea  Negative: Vomiting,  Leg swelling   Physical Exam  BP (!) 160/95 (BP Location: Right Arm)   Pulse 88   Temp 97.6 F (36.4 C) (Oral)   Resp 17   SpO2 100%  Gen:   Awake, no distress   Resp:  Normal effort  MSK:   Moves extremities without difficulty  Other:  S1, S2 no M/R/G  Medical Decision Making  Medically screening exam initiated at 11:22 AM.  Appropriate orders placed.  Keaun Streed was informed that the remainder of the evaluation will be completed by another provider, this initial triage assessment does not replace that evaluation, and the importance of remaining in the ED until their evaluation is complete.    Theron Arista, PA-C 02/05/21 1123    Arby Barrette, MD 02/12/21 2112

## 2021-02-05 NOTE — ED Triage Notes (Signed)
Pt reports intermittent central chest pain since yesterday. Endorses associated shob.

## 2021-02-05 NOTE — ED Provider Notes (Signed)
[facility] EMERGENCY DEPARTMENT Provider Note   CSN: [id number] Arrival date & time: 02/05/21  1054     History Chief Complaint  Patient presents with  . Chest Pain    Dominic Hill is a 50 y.o. male.   Chest Pain Pain location:  Epigastric Pain quality: burning   Pain radiates to:  Does not radiate Pain severity:  Mild Onset quality:  Gradual Timing:  Intermittent Progression:  Waxing and waning Chronicity:  New Context: eating   Relieved by:  Nothing Worsened by:  Nothing Associated symptoms: shortness of breath   Associated symptoms: no abdominal pain, no anxiety, no back pain, no cough, no fever, no palpitations and no vomiting   Risk factors: diabetes mellitus and hypertension   Risk factors: no coronary artery disease and no high cholesterol        Past Medical History:  Diagnosis Date  . Diabetes mellitus without complication ([place])   . Hypertension     Patient Active Problem List   Diagnosis Date Noted  . Controlled type 2 diabetes mellitus without complication, without long-term current use of insulin ([place]) 03/29/2018  . Hypertension 03/29/2018  . Elevated blood pressure reading 03/28/2017    No past surgical history on file.     Family History  Problem Relation Age of Onset  . Hypertension Sister     Social History   Tobacco Use  . Smoking status: Never Smoker  . Smokeless tobacco: Never Used  Vaping Use  . Vaping Use: Never used  Substance Use Topics  . Alcohol use: No    Alcohol/week: 0.0 standard drinks  . Drug use: No    Home Medications Prior to Admission medications   Medication Sig Start Date End Date Taking? Authorizing Provider  omeprazole (PRILOSEC) 20 MG capsule Take 1 capsule (20 mg total) by mouth daily for 14 days. 02/05/21 02/19/21 Yes Lelania Bia, DO  sucralfate (CARAFATE) 1 g tablet Take 1 tablet (1 g total) by mouth 4 (four) times daily -  with meals and at bedtime for 14 days. 02/05/21 02/19/21 Yes  Hulan Szumski, DO  blood glucose meter kit and supplies Dispense based on patient and insurance preference. Use up to four times daily as directed. (FOR ICD-10 E10.9, E11.9). 04/20/20   Posey Boyer, MD  dapagliflozin propanediol (FARXIGA) 10 MG TABS tablet Take 1 tablet (10 mg total) by mouth daily before breakfast. 11/09/20   Maximiano Coss, NP  Dulaglutide (TRULICITY) 3 IW/9.7LG SOPN Inject 3 mg as directed once a week. 11/09/20   Maximiano Coss, NP  Dulaglutide 1.5 MG/0.5ML SOPN Inject 1.5 mg into the skin once a week. 09/10/20   Maximiano Coss, NP  insulin detemir (LEVEMIR) 100 UNIT/ML FlexPen Inject 20 Units into the skin daily. 11/09/20   Maximiano Coss, NP  metFORMIN (GLUCOPHAGE) 1000 MG tablet Take 1 tablet (1,000 mg total) by mouth 2 (two) times daily with a meal. 11/09/20   Maximiano Coss, NP  pravastatin (PRAVACHOL) 20 MG tablet Take 1 tablet (20 mg total) by mouth daily. Patient not taking: Reported on 11/09/2020 06/22/20   Maximiano Coss, NP    Allergies    Patient has no known allergies.  Review of Systems   Review of Systems  Constitutional: Negative for chills and fever.  HENT: Negative for ear pain and sore throat.   Eyes: Negative for pain and visual disturbance.  Respiratory: Positive for shortness of breath. Negative for cough.   Cardiovascular: Positive for chest pain. Negative for palpitations.  Gastrointestinal: Negative for abdominal pain and vomiting.  Genitourinary: Negative for dysuria and hematuria.  Musculoskeletal: Negative for arthralgias and back pain.  Skin: Negative for color change and rash.  Neurological: Negative for seizures and syncope.  All other systems reviewed and are negative.   Physical Exam Updated Vital Signs  ED Triage Vitals  Enc Vitals Group     BP 02/05/21 1059 (!) 160/95     Pulse Rate 02/05/21 1059 88     Resp 02/05/21 1059 17     Temp 02/05/21 1059 97.6 F (36.4 C)     Temp Source 02/05/21 1059 Oral     SpO2 02/05/21  1059 100 %     Weight --      Height --      Head Circumference --      Peak Flow --      Pain Score 02/05/21 1129 6     Pain Loc --      Pain Edu? --      Excl. in [place]? --     Physical Exam Vitals and nursing note reviewed.  Constitutional:      General: He is not in acute distress.    Appearance: He is well-developed. He is not ill-appearing.  HENT:     Head: Normocephalic and atraumatic.  Eyes:     Extraocular Movements: Extraocular movements intact.     Conjunctiva/sclera: Conjunctivae normal.     Pupils: Pupils are equal, round, and reactive to light.  Cardiovascular:     Rate and Rhythm: Normal rate and regular rhythm.     Pulses:          Radial pulses are 2+ on the right side and 2+ on the left side.     Heart sounds: Normal heart sounds. No murmur heard.   Pulmonary:     Effort: Pulmonary effort is normal. No respiratory distress.     Breath sounds: Normal breath sounds. No decreased breath sounds or wheezing.  Abdominal:     Palpations: Abdomen is soft.     Tenderness: There is abdominal tenderness.     Comments: Epigastric   Musculoskeletal:        General: Normal range of motion.     Cervical back: Normal range of motion and neck supple.  Skin:    General: Skin is warm and dry.     Capillary Refill: Capillary refill takes less than 2 seconds.  Neurological:     General: No focal deficit present.     Mental Status: He is alert.  Psychiatric:        Mood and Affect: Mood normal.     ED Results / Procedures / Treatments   Labs (all labs ordered are listed, but only abnormal results are displayed) Labs Reviewed  BASIC METABOLIC PANEL - Abnormal; Notable for the following components:      Result Value   Sodium 133 (*)    Glucose, Bld 619 (*)    All other components within normal limits  CBG MONITORING, ED - Abnormal; Notable for the following components:   Glucose-Capillary 368 (*)    All other components within normal limits  CBC  HEPATIC  FUNCTION PANEL  LIPASE, BLOOD  TROPONIN I (HIGH SENSITIVITY)  TROPONIN I (HIGH SENSITIVITY)    EKG EKG Interpretation  Date/Time:  Tuesday February 05 2021 11:05:50 EDT Ventricular Rate:  81 PR Interval:  156 QRS Duration: 140 QT Interval:  412 QTC Calculation: 478 R Axis:   -35 Text  Interpretation: Normal sinus rhythm Left axis deviation Right bundle branch block T wave abnormality, consider inferior ischemia Abnormal ECG old RBBB. increased t wave inversion ant/lateral. Confirmed by Charlesetta Shanks [phone]) on 02/05/2021 11:10:51 AM   Radiology DG Chest 2 View  Result Date: 02/05/2021 CLINICAL DATA:  Chest pain and nausea EXAM: CHEST - 2 VIEW COMPARISON:  None. FINDINGS: Lungs are clear. Heart size and pulmonary vascularity are normal. No adenopathy. No pneumothorax. No bone lesions. IMPRESSION: Lungs clear.  Cardiac silhouette normal. Electronically Signed   By: Lowella Grip III M.D.   On: 02/05/2021 12:20    Procedures Procedures   Medications Ordered in ED Medications  sodium chloride 0.9 % bolus 1,000 mL (0 mLs Intravenous Stopped 02/05/21 1937)  insulin aspart (novoLOG) injection 10 Units (10 Units Subcutaneous Given 02/05/21 1817)  alum & mag hydroxide-simeth (MAALOX/MYLANTA) 200-200-20 MG/5ML suspension 30 mL (30 mLs Oral Given 02/05/21 1801)    And  lidocaine (XYLOCAINE) 2 % viscous mouth solution 15 mL (15 mLs Oral Given 02/05/21 1801)    ED Course  I have reviewed the triage vital signs and the nursing notes.  Pertinent labs & imaging results that were available during my care of the patient were reviewed by me and considered in my medical decision making (see chart for details).    MDM Rules/Calculators/A&P                          Dominic Hill is here with chest pain/epigastric pain.  On and off for the last day or so.  Feels like a burning pain in his upper abdomen and lower chest.  PERC negative and doubt PE.  Heart score is 3.  EKG shows overall no new ischemic  changes.  EKG appears to be at baseline he has some T wave inversions in bundle branch block but overall unchanged from prior EKG.  Overall has noncardiac sounding chest pain.  Initial troponin is 3 we will get second troponin.  Will give GI cocktail and check hepatic function panel and lipase.  Patient is not in DKA however does have severe hyperglycemia with a blood sugar of 619.  Will give fluid bolus and insulin and re-evaluate.  Repeat troponin within normal limits.  Doubt ACS.  Pain much improved with GI cocktail.  Blood sugar improved to 300s.  Overall no significant anemia, electrolyte abnormality otherwise.  Does have some cardiac risk factors and while he had improvement with GI cocktail will start him on reflux medication but will refer him to cardiology for further work-up.  Discharged in good condition.  This chart was dictated using voice recognition software.  Despite best efforts to proofread,  errors can occur which can change the documentation meaning.    Final Clinical Impression(s) / ED Diagnoses Final diagnoses:  Atypical chest pain    Rx / DC Orders ED Discharge Orders         Ordered    omeprazole (PRILOSEC) 20 MG capsule  Daily        02/05/21 1945    sucralfate (CARAFATE) 1 g tablet  3 times daily with meals & bedtime        02/05/21 1945           Lennice Sites, DO 02/05/21 1945

## 2021-04-12 ENCOUNTER — Telehealth: Payer: Self-pay | Admitting: *Deleted

## 2021-04-12 NOTE — Telephone Encounter (Signed)
[facility] is calling for Dr Wynelle Link- patient is in office for DOT physical and they are requesting last A1C reading. For continuity of care- advised last A1c reading in office(PCP) and providing PCP.

## 2021-07-12 ENCOUNTER — Other Ambulatory Visit: Payer: Self-pay

## 2021-07-12 ENCOUNTER — Ambulatory Visit (INDEPENDENT_AMBULATORY_CARE_PROVIDER_SITE_OTHER): Payer: 59 | Admitting: Registered Nurse

## 2021-07-12 ENCOUNTER — Encounter: Payer: Self-pay | Admitting: Registered Nurse

## 2021-07-12 ENCOUNTER — Ambulatory Visit: Payer: 59 | Admitting: Registered Nurse

## 2021-07-12 VITALS — BP 139/90 | HR 90 | Temp 98.1°F | Resp 18 | Ht 72.0 in | Wt 181.4 lb

## 2021-07-12 DIAGNOSIS — R202 Paresthesia of skin: Secondary | ICD-10-CM

## 2021-07-12 DIAGNOSIS — Z23 Encounter for immunization: Secondary | ICD-10-CM

## 2021-07-12 DIAGNOSIS — E1110 Type 2 diabetes mellitus with ketoacidosis without coma: Secondary | ICD-10-CM | POA: Diagnosis not present

## 2021-07-12 LAB — MICROALBUMIN / CREATININE URINE RATIO
Creatinine,U: 23.6 mg/dL
Microalb Creat Ratio: 3 mg/g (ref 0.0–30.0)
Microalb, Ur: <0.7 mg/dL (ref 0.0–1.9)

## 2021-07-12 LAB — POCT GLYCOSYLATED HEMOGLOBIN (HGB A1C): Hemoglobin A1C: 9 % — AB (ref 4.0–5.6)

## 2021-07-12 MED ORDER — DAPAGLIFLOZIN PROPANEDIOL 10 MG PO TABS: 10.0000 mg | ORAL_TABLET | Freq: Every day | ORAL | 0 refills | Status: DC

## 2021-07-12 MED ORDER — METFORMIN HCL 1000 MG PO TABS: 1000.0000 mg | ORAL_TABLET | Freq: Two times a day (BID) | ORAL | 0 refills | Status: DC

## 2021-07-12 MED ORDER — TRULICITY 3 MG/0.5ML ~~LOC~~ SOAJ: 3.0000 mg | SUBCUTANEOUS | 0 refills | Status: DC

## 2021-07-12 MED ORDER — INSULIN DETEMIR 100 UNIT/ML FLEXPEN: 25.0000 [IU] | PEN_INJECTOR | Freq: Every day | SUBCUTANEOUS | 0 refills | Status: DC

## 2021-07-12 MED ORDER — PRAVASTATIN SODIUM 20 MG PO TABS: 20.0000 mg | ORAL_TABLET | Freq: Every day | ORAL | 3 refills | Status: DC

## 2021-07-12 NOTE — Progress Notes (Signed)
Established Patient Office Visit  Subjective:  Patient ID: Dominic Hill, male    DOB: 1971-08-27  Age: 50 y.o. MRN: [id number]  CC:  Chief Complaint  Patient presents with   Diabetes    Patient states he is here to check his A!C no other concerns.    HPI Dominic Hill presents for A1c check  Last A1c:  Lab Results  Component Value Date   HGBA1C 9.0 (A) 07/12/2021    Currently taking: trulicity 24m subq weekly, detemir 20 units daily, farxiga 176mpo qd, metformin 100053mo bid ac Question of transient numbness in hands. Unsure if related to diabetes. No clear pattern to numbness Reports good compliance with medications Diet has been steady Exercise habits have been limited  T2dm eye exam in March - will have records sent.  Past Medical History:  Diagnosis Date   [place]  Hypertension     No past surgical history on file.  Family History  Problem Relation Age of Onset   Hypertension Sister     Social History   Socioeconomic History   Marital status: Married    Spouse name: Not on file   Number of children: Not on file   Years of education: Not on file   Highest education level: Not on file  Occupational History   Occupation: truck driver  Tobacco Use   Smoking status: Never   Smokeless tobacco: Never  Vaping Use   Vaping Use: Never used  Substance and Sexual Activity   Alcohol use: No    Alcohol/week: 0.0 standard drinks   Drug use: No   Sexual activity: Yes    Partners: Female  Other Topics Concern   Not on file  Social History Narrative   Lives with wife,   [place]   Social Determinants of Health   Financial Resource Strain: Not on file  Food Insecurity: Not on file  Transportation Needs: Not on file  Physical Activity: Not on file  Stress: Not on file  Social Connections: Not on file  Intimate Partner Violence: Not on file    Outpatient Medications Prior to Visit   Medication Sig Dispense Refill   blood glucose meter kit and supplies Dispense based on patient and insurance preference. Use up to four times daily as directed. (FOR ICD-10 E10.9, E11.9). 1 each 0   dapagliflozin propanediol (FARXIGA) 10 MG TABS tablet Take 1 tablet (10 mg total) by mouth daily before breakfast. 90 tablet 0   Dulaglutide (TRULICITY) 3 MG/LK/4.4WNPN Inject 3 mg as directed once a week. 6 mL 0   Dulaglutide 1.5 MG/0.5ML SOPN Inject 1.5 mg into the skin once a week. 6 mL 0   insulin detemir (LEVEMIR) 100 UNIT/ML FlexPen Inject 20 Units into the skin daily. 18 mL 0   metFORMIN (GLUCOPHAGE) 1000 MG tablet Take 1 tablet (1,000 mg total) by mouth 2 (two) times daily with a meal. 180 tablet 0   omeprazole (PRILOSEC) 20 MG capsule Take 1 capsule (20 mg total) by mouth daily for 14 days. 14 capsule 0   sucralfate (CARAFATE) 1 g tablet Take 1 tablet (1 g total) by mouth 4 (four) times daily -  with meals and at bedtime for 14 days. 56 tablet 0   pravastatin (PRAVACHOL) 20 MG tablet Take 1 tablet (20 mg total) by mouth daily. (Patient not taking: No sig reported) 90 tablet 3   No facility-administered medications prior to  visit.    No Known Allergies  ROS Review of Systems  Constitutional: Negative.   HENT: Negative.    Eyes: Negative.   Respiratory: Negative.    Cardiovascular: Negative.   Gastrointestinal: Negative.   Genitourinary: Negative.   Musculoskeletal: Negative.   Skin: Negative.   Neurological: Negative.   Psychiatric/Behavioral: Negative.    All other systems reviewed and are negative.    Objective:    Physical Exam Constitutional:      General: He is not in acute distress.    Appearance: Normal appearance. He is normal weight. He is not ill-appearing, toxic-appearing or diaphoretic.  Cardiovascular:     Rate and Rhythm: Normal rate and regular rhythm.     Heart sounds: Normal heart sounds. No murmur heard.   No friction rub. No gallop.  Pulmonary:      Effort: Pulmonary effort is normal. No respiratory distress.     Breath sounds: Normal breath sounds. No stridor. No wheezing, rhonchi or rales.  Chest:     Chest wall: No tenderness.  Neurological:     General: No focal deficit present.     Mental Status: He is alert and oriented to person, place, and time. Mental status is at baseline.  Psychiatric:        Mood and Affect: Mood normal.        Behavior: Behavior normal.        Thought Content: Thought content normal.        Judgment: Judgment normal.    BP 139/90   Pulse 90   Temp 98.1 F (36.7 C) (Temporal)   Resp 18   Ht 6' (1.829 m)   Wt 181 lb 6.4 oz (82.3 kg)   SpO2 100%   BMI 24.60 kg/m  Wt Readings from Last 3 Encounters:  07/12/21 181 lb 6.4 oz (82.3 kg)  02/05/21 165 lb (74.8 kg)  11/09/20 184 lb (83.5 kg)     Health Maintenance Due  Topic Date Due   Pneumococcal Vaccine 3-60 Years old (1 - PCV) Never done   COVID-19 Vaccine (3 - Booster for Pfizer series) 10/04/2020   INFLUENZA VACCINE  04/01/2021   FOOT EXAM  04/20/2021   URINE MICROALBUMIN  04/20/2021   OPHTHALMOLOGY EXAM  06/22/2021    There are no preventive care reminders to display for this patient.  Lab Results  Component Value Date   TSH 1.610 10/14/2019   Lab Results  Component Value Date   WBC 4.8 02/05/2021   HGB 14.5 02/05/2021   HCT 43.3 02/05/2021   MCV 84.1 02/05/2021   PLT 186 02/05/2021   Lab Results  Component Value Date   NA 133 (L) 02/05/2021   K 4.4 02/05/2021   CO2 26 02/05/2021   GLUCOSE 619 (HH) 02/05/2021   BUN 16 02/05/2021   CREATININE 1.09 02/05/2021   BILITOT 0.9 02/05/2021   ALKPHOS 64 02/05/2021   AST 21 02/05/2021   ALT 19 02/05/2021   PROT 7.6 02/05/2021   ALBUMIN 4.3 02/05/2021   CALCIUM 9.3 02/05/2021   ANIONGAP 8 02/05/2021   Lab Results  Component Value Date   CHOL 167 06/22/2020   Lab Results  Component Value Date   HDL 41 06/22/2020   Lab Results  Component Value Date   LDLCALC 79  06/22/2020   Lab Results  Component Value Date   TRIG 285 (H) 06/22/2020   Lab Results  Component Value Date   CHOLHDL 4.1 06/22/2020   Lab Results  Component  Value Date   HGBA1C 9.0 (A) 07/12/2021      Assessment & Plan:   Problem List Items Addressed This Visit   None Visit Diagnoses     DM (diabetes mellitus) type 2, uncontrolled, with ketoacidosis ([place])    -  Primary   Relevant Medications   Dulaglutide (TRULICITY) 3 TM/6.2TV SOPN   insulin detemir (LEVEMIR) 100 UNIT/ML FlexPen   dapagliflozin propanediol (FARXIGA) 10 MG TABS tablet   pravastatin (PRAVACHOL) 20 MG tablet   metFORMIN (GLUCOPHAGE) 1000 MG tablet   Other Relevant Orders   POCT glycosylated hemoglobin (Hb A1C) (Completed)   Microalbumin / creatinine urine ratio   CBC with Differential/Platelet   Lipid panel   Comprehensive metabolic panel   TSH   Paresthesia       Relevant Orders   Vitamin D (25 hydroxy)   Flu vaccine need       Relevant Orders   Flu Vaccine QUAD 6+ mos PF IM (Fluarix Quad PF)       Meds ordered this encounter  Medications   Dulaglutide (TRULICITY) 3 IF/1.2XI SOPN    Sig: Inject 3 mg as directed once a week.    Dispense:  6 mL    Refill:  0    Order Specific Question:   Supervising Provider    Answer:   Carlota Raspberry, JEFFREY R [[id number]]   insulin detemir (LEVEMIR) 100 UNIT/ML FlexPen    Sig: Inject 25 Units into the skin daily.    Dispense:  30 mL    Refill:  0    Order Specific Question:   Supervising Provider    Answer:   Carlota Raspberry, JEFFREY R [[id number]]   dapagliflozin propanediol (FARXIGA) 10 MG TABS tablet    Sig: Take 1 tablet (10 mg total) by mouth daily before breakfast.    Dispense:  90 tablet    Refill:  0    Order Specific Question:   Supervising Provider    Answer:   Carlota Raspberry, JEFFREY R [[id number]]   pravastatin (PRAVACHOL) 20 MG tablet    Sig: Take 1 tablet (20 mg total) by mouth daily.    Dispense:  90 tablet    Refill:  3    Order Specific Question:   Supervising Provider     Answer:   Carlota Raspberry, JEFFREY R [[id number]]   metFORMIN (GLUCOPHAGE) 1000 MG tablet    Sig: Take 1 tablet (1,000 mg total) by mouth 2 (two) times daily with a meal.    Dispense:  180 tablet    Refill:  0    Order Specific Question:   Supervising Provider    Answer:   Carlota Raspberry, JEFFREY R [[id number]]    Follow-up: No follow-ups on file.   PLAN Uncontrolled t2dm  A1c down to 9.0. encouraged med compliance and lifestyle modifications. Increase detemir to 25 units daily. Check vitamin d for paresthesia Recheck in 3 mo Labs collected. Will follow up with the patient as warranted. Patient encouraged to call clinic with any questions, comments, or concerns.  Maximiano Coss, NP

## 2021-07-12 NOTE — Patient Instructions (Addendum)
Mr. Tejas Seawood to see you  Sugars are down a little to 9.0 -  Ideally we see sugars are home ranging from 80-130 We want A1c to be below 7.0  Increase detemir insulin to 25 units daily. Continue other medications as prescribed  Return in 3 mo for recheck.  Uncontrolled sugars can lead to increased risk for kidney failure, heart disease, and a variety of other problems   Can call [facility] at [phone] to schedule a DOT physical   Letter provided to show A1c and BP appropriate today.    Thank you  Rich     If you have lab work done today you will be contacted with your lab results within the next 2 weeks.  If you have not heard from [place] then please contact us. The fastest way to get your results is to register for My Chart.   IF you received an x-ray today, you will receive an invoice from [facility]. Please contact [facility] at [phone] with questions or concerns regarding your invoice.   IF you received labwork today, you will receive an invoice from [facility]. Please contact [facility] at [phone] with questions or concerns regarding your invoice.   Our billing staff will not be able to assist you with questions regarding bills from these companies.  You will be contacted with the lab results as soon as they are available. The fastest way to get your results is to activate your My Chart account. Instructions are located on the last page of this paperwork. If you have not heard from [place] regarding the results in 2 weeks, please contact this office.

## 2021-10-18 ENCOUNTER — Ambulatory Visit: Payer: 59 | Admitting: Registered Nurse

## 2021-10-18 IMAGING — DX DG CHEST 2V
2 series · 2 of 2 positions shown · non-contrast
Comparison: None.

CLINICAL DATA: Chest pain and nausea

EXAM:
CHEST - 2 VIEW

[chest pa]
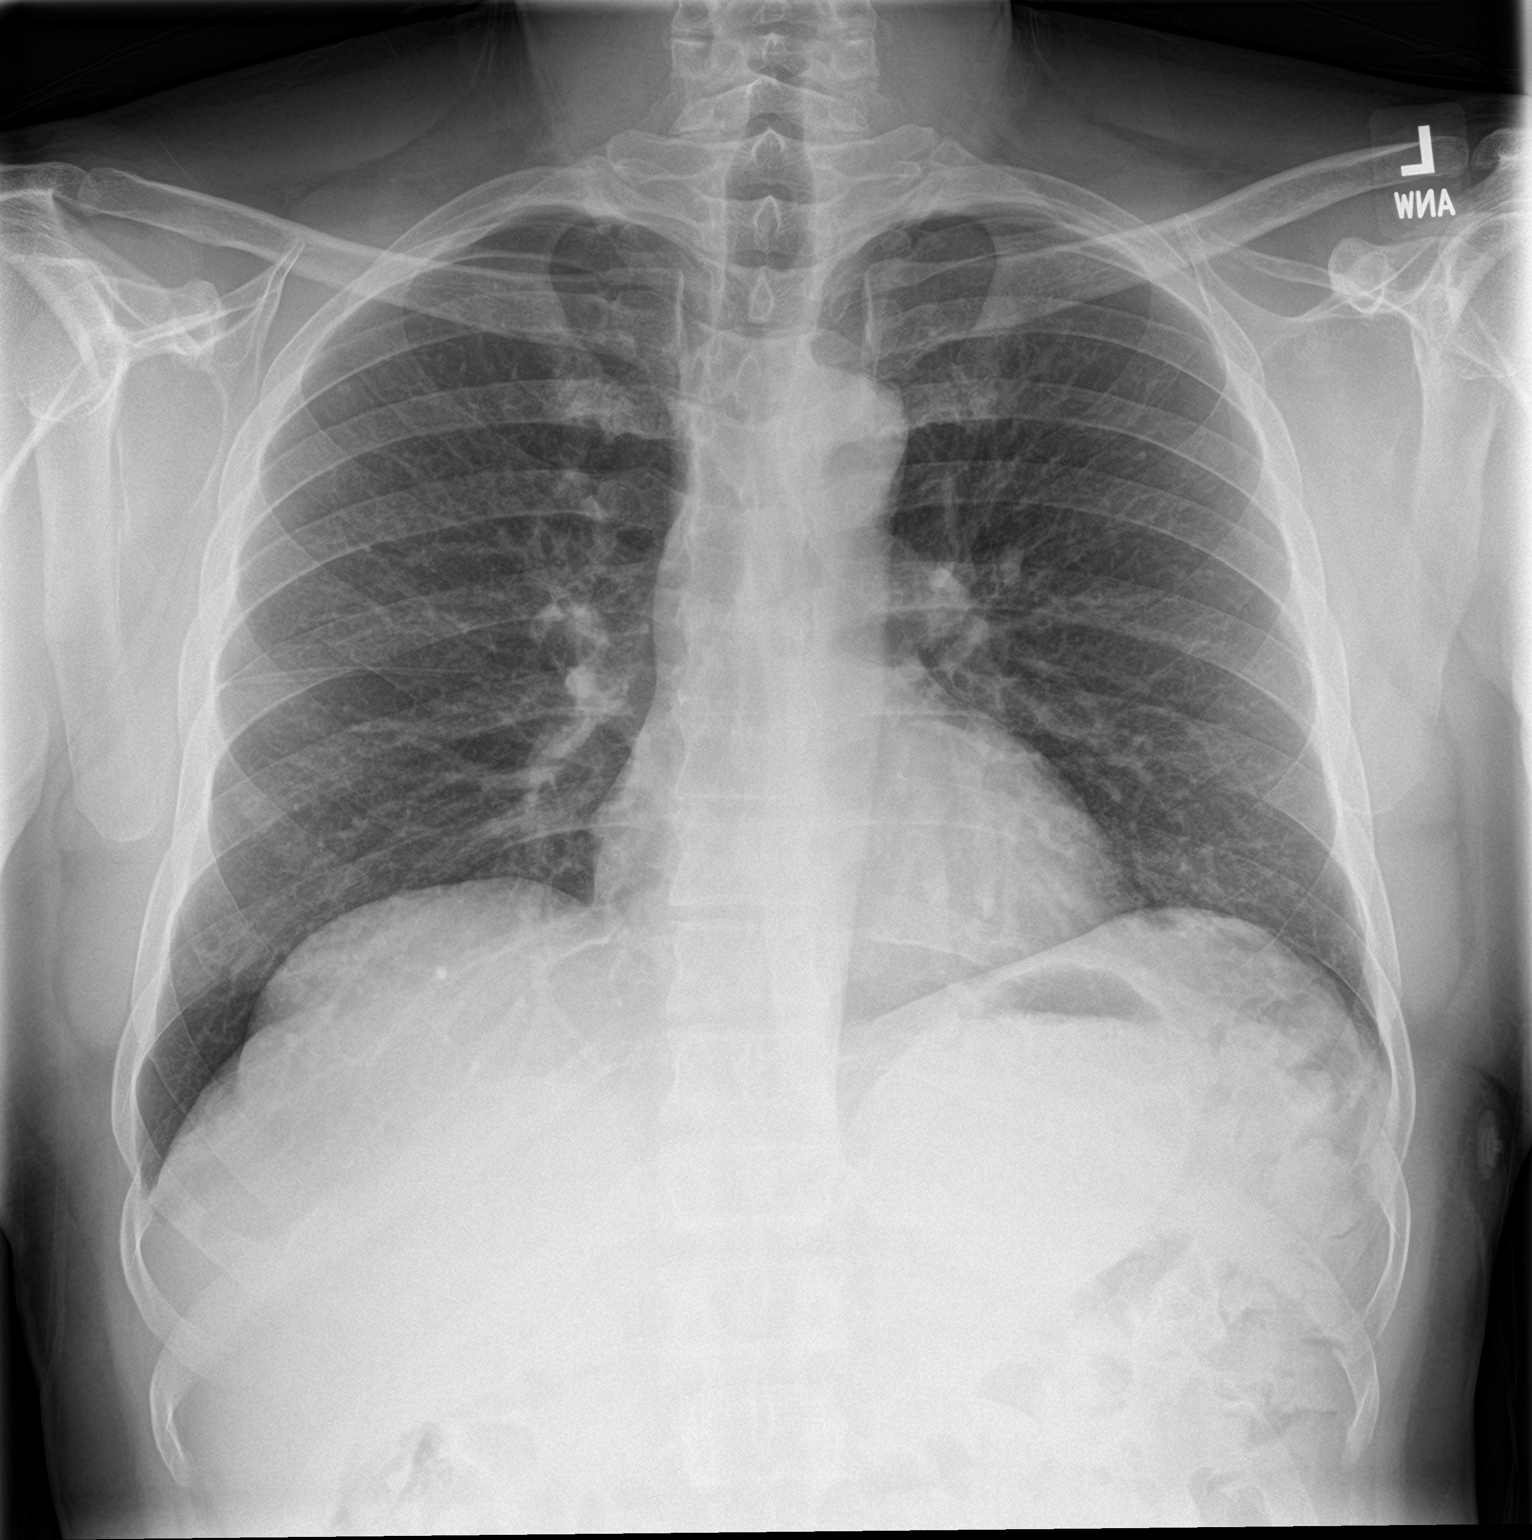

[chest lat]
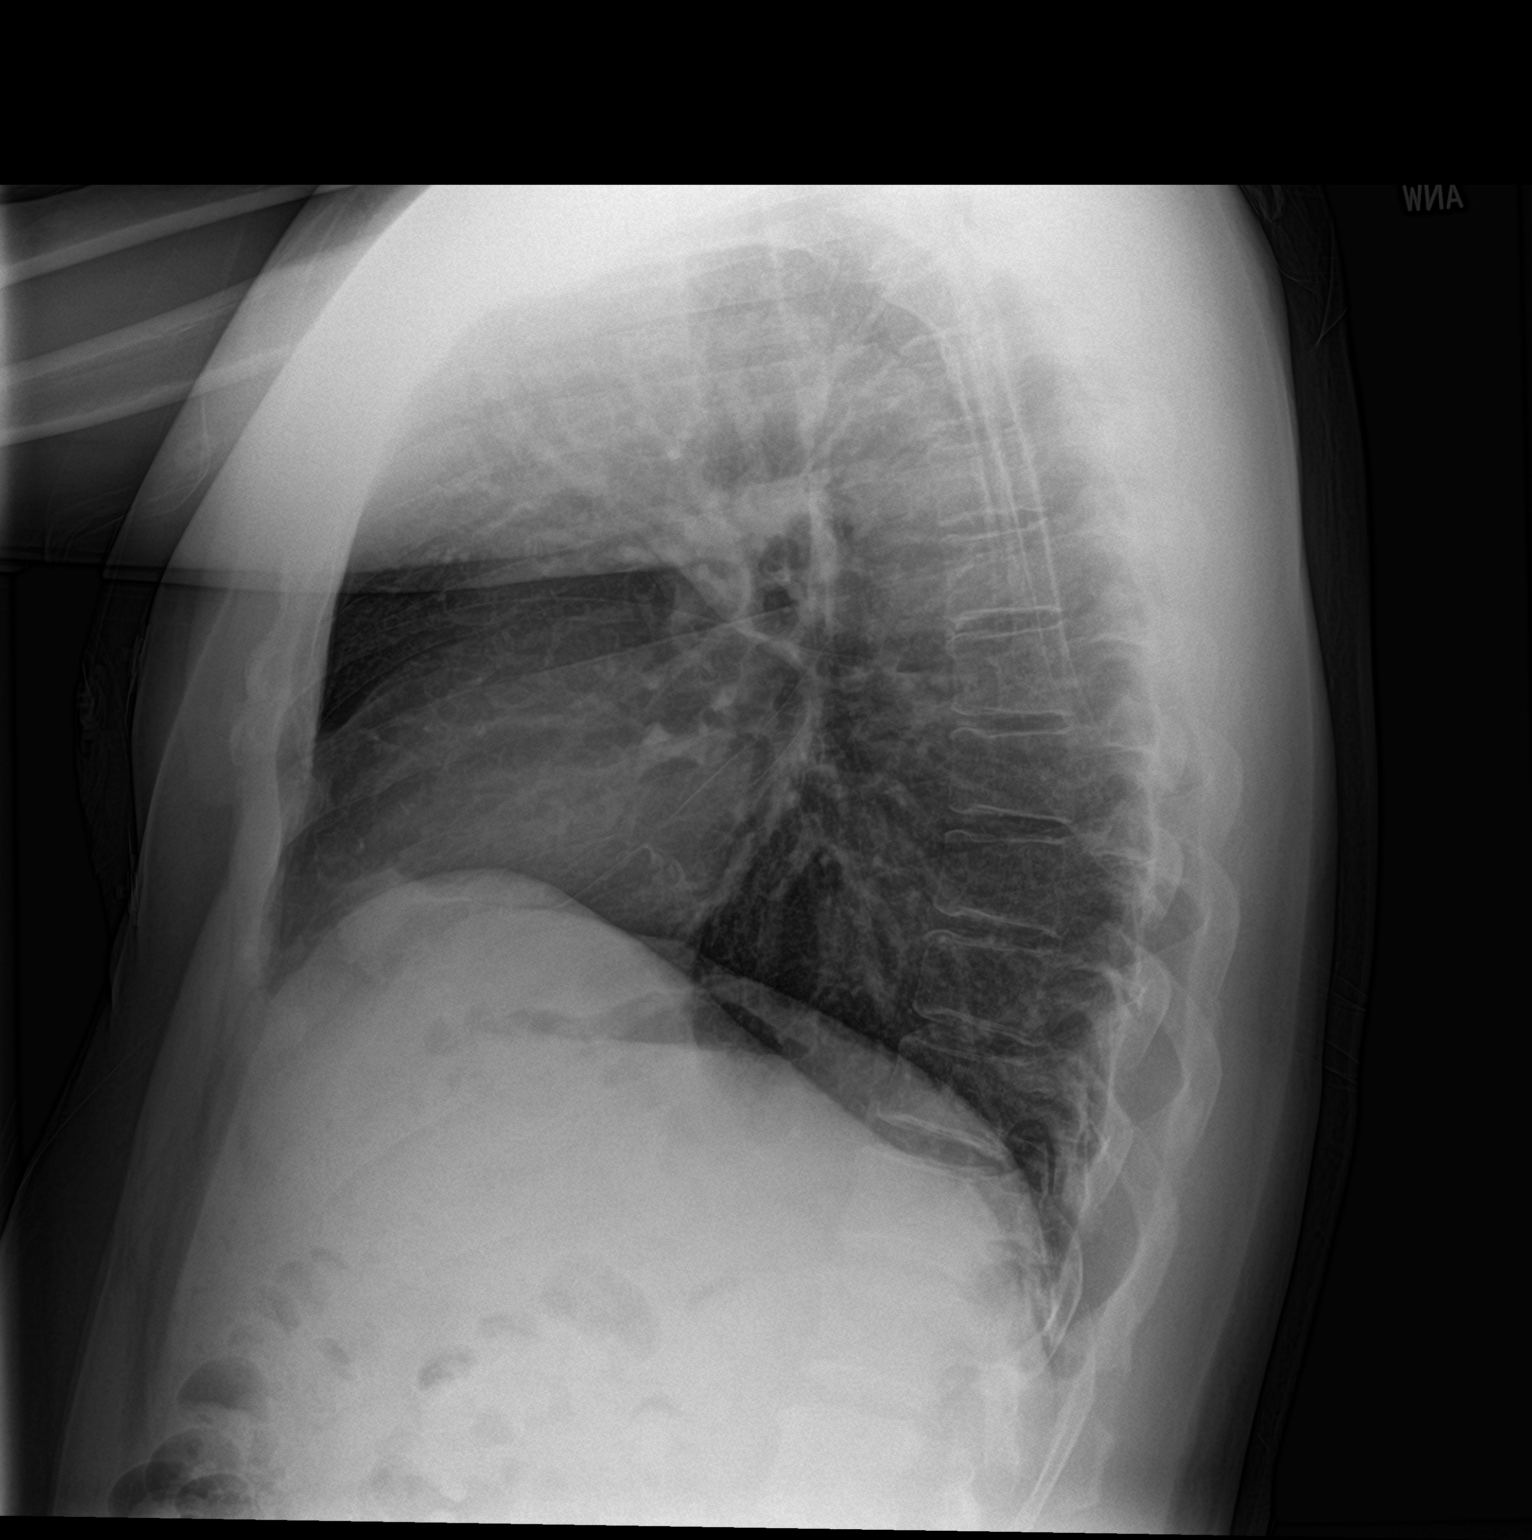

[2 of 2 positions shown; findings below may reference images not displayed]

FINDINGS: Lungs are clear. Heart size and pulmonary vascularity are normal. No
adenopathy. No pneumothorax. No bone lesions.
IMPRESSION: Lungs clear.  Cardiac silhouette normal.

## 2022-03-12 ENCOUNTER — Ambulatory Visit: Payer: Self-pay | Admitting: Registered Nurse

## 2022-03-26 ENCOUNTER — Other Ambulatory Visit: Payer: Self-pay

## 2022-03-26 ENCOUNTER — Ambulatory Visit (INDEPENDENT_AMBULATORY_CARE_PROVIDER_SITE_OTHER): Payer: Self-pay | Admitting: Registered Nurse

## 2022-03-26 ENCOUNTER — Encounter: Payer: Self-pay | Admitting: Registered Nurse

## 2022-03-26 VITALS — BP 130/82 | HR 86 | Temp 98.0°F | Resp 18 | Wt 170.0 lb

## 2022-03-26 DIAGNOSIS — E1110 Type 2 diabetes mellitus with ketoacidosis without coma: Secondary | ICD-10-CM

## 2022-03-26 LAB — POCT GLYCOSYLATED HEMOGLOBIN (HGB A1C): Hemoglobin A1C: 15 % — AB (ref 4.0–5.6)

## 2022-03-26 MED ORDER — TRULICITY 4.5 MG/0.5ML ~~LOC~~ SOAJ: 4.5000 mg | SUBCUTANEOUS | 0 refills | Status: DC

## 2022-03-26 MED ORDER — FREESTYLE LIBRE 14 DAY READER DEVI: 1.0000 | 3 refills | Status: DC

## 2022-03-26 MED ORDER — FREESTYLE LIBRE 14 DAY SENSOR MISC: 1.0000 | 3 refills | Status: DC

## 2022-03-26 MED ORDER — DAPAGLIFLOZIN PROPANEDIOL 10 MG PO TABS: 10.0000 mg | ORAL_TABLET | Freq: Every day | ORAL | 0 refills | Status: DC

## 2022-03-26 MED ORDER — INSULIN DETEMIR 100 UNIT/ML FLEXPEN: 30.0000 [IU] | PEN_INJECTOR | Freq: Every day | SUBCUTANEOUS | 0 refills | Status: DC

## 2022-03-26 MED ORDER — METFORMIN HCL 1000 MG PO TABS: 1000.0000 mg | ORAL_TABLET | Freq: Two times a day (BID) | ORAL | 0 refills | Status: AC

## 2022-03-26 NOTE — Progress Notes (Signed)
Established Patient Office Visit  Subjective:  Patient ID: Dominic Hill, male    DOB: Apr 24, 1971  Age: 51 y.o. MRN: [id number]  CC:  Chief Complaint  Patient presents with   Diabetes    Patient is here for follow up on diabetes.    HPI Dominic Hill presents for t2dm  Last A1c:  Lab Results  Component Value Date   HGBA1C 15.0 (A) 03/26/2022    Currently taking: farxiga 79YI po qd, trulicity 25m subq weekly, metformin 10065mpo bid ac, levemir 25 units nightly.  Admits he has run out of medications, hence rise in sugars.  No new complications Reports good compliance with medications Diet has been poor Exercise habits have been limited.   Outpatient Medications Prior to Visit  Medication Sig Dispense Refill   pravastatin (PRAVACHOL) 20 MG tablet Take 1 tablet (20 mg total) by mouth daily. 90 tablet 3   blood glucose meter kit and supplies Dispense based on patient and insurance preference. Use up to four times daily as directed. (FOR ICD-10 E10.9, E11.9). 1 each 0   dapagliflozin propanediol (FARXIGA) 10 MG TABS tablet Take 1 tablet (10 mg total) by mouth daily before breakfast. 90 tablet 0   Dulaglutide (TRULICITY) 3 MGAX/6.5VVOPN Inject 3 mg as directed once a week. 6 mL 0   insulin detemir (LEVEMIR) 100 UNIT/ML FlexPen Inject 25 Units into the skin daily. 30 mL 0   metFORMIN (GLUCOPHAGE) 1000 MG tablet Take 1 tablet (1,000 mg total) by mouth 2 (two) times daily with a meal. 180 tablet 0   omeprazole (PRILOSEC) 20 MG capsule Take 1 capsule (20 mg total) by mouth daily for 14 days. 14 capsule 0   sucralfate (CARAFATE) 1 g tablet Take 1 tablet (1 g total) by mouth 4 (four) times daily -  with meals and at bedtime for 14 days. 56 tablet 0   No facility-administered medications prior to visit.    Review of Systems  Constitutional: Negative.   HENT: Negative.    Eyes: Negative.   Respiratory: Negative.    Cardiovascular: Negative.   Gastrointestinal: Negative.    Genitourinary: Negative.   Musculoskeletal: Negative.   Skin: Negative.   Neurological: Negative.   Psychiatric/Behavioral: Negative.    All other systems reviewed and are negative.     Objective:     BP 130/82   Pulse 86   Temp 98 F (36.7 C) (Temporal)   Resp 18   Wt 170 lb (77.1 kg)   SpO2 98%   BMI 23.06 kg/m   Wt Readings from Last 3 Encounters:  03/26/22 170 lb (77.1 kg)  07/12/21 181 lb 6.4 oz (82.3 kg)  02/05/21 165 lb (74.8 kg)   Physical Exam Constitutional:      General: He is not in acute distress.    Appearance: Normal appearance. He is normal weight. He is not ill-appearing, toxic-appearing or diaphoretic.  Cardiovascular:     Rate and Rhythm: Normal rate and regular rhythm.     Heart sounds: Normal heart sounds. No murmur heard.    No friction rub. No gallop.  Pulmonary:     Effort: Pulmonary effort is normal. No respiratory distress.     Breath sounds: Normal breath sounds. No stridor. No wheezing, rhonchi or rales.  Chest:     Chest wall: No tenderness.  Neurological:     General: No focal deficit present.     Mental Status: He is alert and oriented to person, place, and time. Mental  status is at baseline.  Psychiatric:        Mood and Affect: Mood normal.        Behavior: Behavior normal.        Thought Content: Thought content normal.        Judgment: Judgment normal.     Results for orders placed or performed in visit on 03/26/22  POCT glycosylated hemoglobin (Hb A1C)  Result Value Ref Range   Hemoglobin A1C 15.0 (A) 4.0 - 5.6 %   HbA1c POC (<> result, manual entry)     HbA1c, POC (prediabetic range)     HbA1c, POC (controlled diabetic range)        The 10-year ASCVD risk score (Arnett DK, et al., 2019) is: 9.7%    Assessment & Plan:   Problem List Items Addressed This Visit       Endocrine   DM (diabetes mellitus) type 2, uncontrolled, with ketoacidosis ([place]) - Primary    Extremely poor control despite pt reports of  generally stable diet, some regular activity. Will increase levemir to 30 units and trulicity to 0.3UU subq weekly. Will seek endo consult for further management. Advised pt to follow up with new PCP in 4-6 weeks to recheck medication efficacy.      Relevant Medications   dapagliflozin propanediol (FARXIGA) 10 MG TABS tablet   metFORMIN (GLUCOPHAGE) 1000 MG tablet   insulin detemir (LEVEMIR) 100 UNIT/ML FlexPen   Dulaglutide (TRULICITY) 4.5 EK/8.0KL SOPN   Continuous Blood Gluc Receiver (FREESTYLE LIBRE 14 DAY READER) DEVI   Continuous Blood Gluc Sensor (FREESTYLE LIBRE 14 DAY SENSOR) MISC   Other Relevant Orders   POCT glycosylated hemoglobin (Hb A1C) (Completed)   CBC with Differential/Platelet   Comprehensive metabolic panel   Lipid panel   Ambulatory referral to Endocrinology    Meds ordered this encounter  Medications   dapagliflozin propanediol (FARXIGA) 10 MG TABS tablet    Sig: Take 1 tablet (10 mg total) by mouth daily before breakfast.    Dispense:  90 tablet    Refill:  0    Order Specific Question:   Supervising Provider    Answer:   Carlota Raspberry, JEFFREY R [[id number]]   metFORMIN (GLUCOPHAGE) 1000 MG tablet    Sig: Take 1 tablet (1,000 mg total) by mouth 2 (two) times daily with a meal.    Dispense:  180 tablet    Refill:  0    Order Specific Question:   Supervising Provider    Answer:   Carlota Raspberry, JEFFREY R [[id number]]   insulin detemir (LEVEMIR) 100 UNIT/ML FlexPen    Sig: Inject 30 Units into the skin daily.    Dispense:  21 mL    Refill:  0    Order Specific Question:   Supervising Provider    Answer:   Carlota Raspberry, JEFFREY R [[id number]]   Dulaglutide (TRULICITY) 4.5 KJ/1.7HX SOPN    Sig: Inject 4.5 mg as directed once a week.    Dispense:  6 mL    Refill:  0    Order Specific Question:   Supervising Provider    Answer:   Carlota Raspberry, JEFFREY R [[id number]]   Continuous Blood Gluc Receiver (FREESTYLE LIBRE 14 DAY READER) DEVI    Sig: 1 each by Does not apply route every 14 (fourteen) days.     Dispense:  6 each    Refill:  3    Order Specific Question:   Supervising Provider    Answer:   Carlota Raspberry, JEFFREY R [[id number]]  Continuous Blood Gluc Sensor (FREESTYLE LIBRE 14 DAY SENSOR) MISC    Sig: 1 each by Does not apply route every 14 (fourteen) days.    Dispense:  6 each    Refill:  3    Order Specific Question:   Supervising Provider    Answer:   Carlota Raspberry, JEFFREY R [[id number]]    Return in about 4 weeks (around 04/23/2022) for diabetes.    Maximiano Coss, NP

## 2022-03-26 NOTE — Assessment & Plan Note (Signed)
Extremely poor control despite pt reports of generally stable diet, some regular activity. Will increase levemir to 30 units and trulicity to 4.5mg  subq weekly. Will seek endo consult for further management. Advised pt to follow up with new PCP in 4-6 weeks to recheck medication efficacy.

## 2022-03-26 NOTE — Patient Instructions (Addendum)
Mr. Alessio -    Your diabetes is very uncontrolled. This can have significant effects on kidney function and cardiovascular health - including heart attack, stroke, kidney failure, and more.   Your A1c today is greater than 15 - this is around 3 times as high as we'd like it to be - we are aiming to get this down below 6 if possible.   I recommend that you call one of these providers to see them in 4-6 weeks if possible:  Glenetta Hew, MD Jacquiline Doe, MD Edwina Barth, MD Letta Moynahan Early, NP Jiles Prows, DNP  Stay well, and thank you for letting me take part in your care,  Rich   If you have lab work done today you will be contacted with your lab results within the next 2 weeks.  If you have not heard from [place] then please contact us. The fastest way to get your results is to register for My Chart.   IF you received an x-ray today, you will receive an invoice from [facility]. Please contact [facility] at [phone] with questions or concerns regarding your invoice.   IF you received labwork today, you will receive an invoice from [facility]. Please contact [facility] at [phone] with questions or concerns regarding your invoice.   Our billing staff will not be able to assist you with questions regarding bills from these companies.  You will be contacted with the lab results as soon as they are available. The fastest way to get your results is to activate your My Chart account. Instructions are located on the last page of this paperwork. If you have not heard from [place] regarding the results in 2 weeks, please contact this office.

## 2022-04-30 ENCOUNTER — Ambulatory Visit: Payer: Self-pay | Admitting: Family Medicine

## 2022-06-28 ENCOUNTER — Encounter (HOSPITAL_COMMUNITY): Payer: Self-pay | Admitting: Pharmacy Technician

## 2022-06-28 ENCOUNTER — Emergency Department (HOSPITAL_COMMUNITY): Payer: Commercial Managed Care - HMO

## 2022-06-28 ENCOUNTER — Emergency Department (HOSPITAL_COMMUNITY)
Admission: EM | Admit: 2022-06-28 | Discharge: 2022-06-29 | Disposition: A | Discharge status: Home / Self Care | Payer: Commercial Managed Care - HMO | Attending: Emergency Medicine | Admitting: Emergency Medicine

## 2022-06-28 DIAGNOSIS — Z20822 Contact with and (suspected) exposure to covid-19: Secondary | ICD-10-CM | POA: Diagnosis not present

## 2022-06-28 DIAGNOSIS — Z7984 Long term (current) use of oral hypoglycemic drugs: Secondary | ICD-10-CM | POA: Insufficient documentation

## 2022-06-28 DIAGNOSIS — R1084 Generalized abdominal pain: Secondary | ICD-10-CM | POA: Diagnosis present

## 2022-06-28 DIAGNOSIS — Z794 Long term (current) use of insulin: Secondary | ICD-10-CM | POA: Diagnosis not present

## 2022-06-28 DIAGNOSIS — I10 Essential (primary) hypertension: Secondary | ICD-10-CM | POA: Diagnosis not present

## 2022-06-28 DIAGNOSIS — R110 Nausea: Secondary | ICD-10-CM

## 2022-06-28 DIAGNOSIS — E119 Type 2 diabetes mellitus without complications: Secondary | ICD-10-CM | POA: Diagnosis not present

## 2022-06-28 DIAGNOSIS — N3090 Cystitis, unspecified without hematuria: Secondary | ICD-10-CM

## 2022-06-28 LAB — CBC WITH DIFFERENTIAL/PLATELET
Abs Immature Granulocytes: 0.01 10*3/uL (ref 0.00–0.07)
Basophils Absolute: 0 10*3/uL (ref 0.0–0.1)
Basophils Relative: 1 %
Eosinophils Absolute: 0.1 10*3/uL (ref 0.0–0.5)
Eosinophils Relative: 2 %
HCT: 42.7 % (ref 39.0–52.0)
Hemoglobin: 14.6 g/dL (ref 13.0–17.0)
Immature Granulocytes: 0 %
Lymphocytes Relative: 33 %
Lymphs Abs: 1.9 10*3/uL (ref 0.7–4.0)
MCH: 28.3 pg (ref 26.0–34.0)
MCHC: 34.2 g/dL (ref 30.0–36.0)
MCV: 82.8 fL (ref 80.0–100.0)
Monocytes Absolute: 0.5 10*3/uL (ref 0.1–1.0)
Monocytes Relative: 9 %
Neutro Abs: 3.2 10*3/uL (ref 1.7–7.7)
Neutrophils Relative %: 55 %
Platelets: 247 10*3/uL (ref 150–400)
RBC: 5.16 MIL/uL (ref 4.22–5.81)
RDW: 12.5 % (ref 11.5–15.5)
WBC: 5.8 10*3/uL (ref 4.0–10.5)
nRBC: 0 % (ref 0.0–0.2)

## 2022-06-28 LAB — COMPREHENSIVE METABOLIC PANEL
ALT: 9 U/L (ref 0–44)
AST: 11 U/L — ABNORMAL LOW (ref 15–41)
Albumin: 4.1 g/dL (ref 3.5–5.0)
Alkaline Phosphatase: 51 U/L (ref 38–126)
Anion gap: 6 (ref 5–15)
BUN: 7 mg/dL (ref 6–20)
CO2: 27 mmol/L (ref 22–32)
Calcium: 9.5 mg/dL (ref 8.9–10.3)
Chloride: 108 mmol/L (ref 98–111)
Creatinine, Ser: 1.1 mg/dL (ref 0.61–1.24)
GFR, Estimated: >60 mL/min (ref >60)
Glucose, Bld: 105 mg/dL — ABNORMAL HIGH (ref 70–99)
Potassium: 4.5 mmol/L (ref 3.5–5.1)
Sodium: 141 mmol/L (ref 135–145)
Total Bilirubin: 1 mg/dL (ref 0.3–1.2)
Total Protein: 7.2 g/dL (ref 6.5–8.1)

## 2022-06-28 LAB — URINALYSIS, ROUTINE W REFLEX MICROSCOPIC
Bilirubin Urine: NEGATIVE
Glucose, UA: NEGATIVE mg/dL
Hgb urine dipstick: NEGATIVE
Ketones, ur: NEGATIVE mg/dL
Leukocytes,Ua: NEGATIVE
Nitrite: NEGATIVE
Protein, ur: NEGATIVE mg/dL
Specific Gravity, Urine: 1.011 (ref 1.005–1.030)
pH: 6 (ref 5.0–8.0)

## 2022-06-28 LAB — LIPASE, BLOOD: Lipase: 35 U/L (ref 11–51)

## 2022-06-28 MED ORDER — IOHEXOL 350 MG/ML SOLN
75.0000 mL | Freq: Once | INTRAVENOUS | Status: AC | PRN
  Administered 2022-06-28: 75 mL via INTRAVENOUS

## 2022-06-28 NOTE — Progress Notes (Signed)
Pt not answering in lobby when called for CT @ 2116

## 2022-06-28 NOTE — ED Provider Triage Note (Signed)
Emergency Medicine Provider Triage Evaluation Note  Rocky Gladden , a 51 y.o. male  was evaluated in triage.  Pt complains of abdominal pain which began 3 days ago.  Patient does report being a truck driver, states that he happened to have severe belly pain when driving to [place] from [place].  He described as a generalized cramping.  Feels that he is got ongoing indigestion.  Pain is exacerbated with sitting down, improved with lying flat.  He has taken some Aleve, Tylenol to help with pain control without much improvement in symptoms.  Also endorses some nausea and a couple episodes of vomiting however without any blood noted.  Review of Systems  Positive: abdominal pain, nausea, vomiting Negative: Fever, chest pain, urinary symptoms.  Physical Exam  There were no vitals taken for this visit. Gen:   Awake, no distress   Resp:  Normal effort  MSK:   Moves extremities without difficulty  Other:  Abdomen is soft, nonfocally tender to palpation.  Bowel sounds are present and equal.  No CVA tenderness bilaterally.  Medical Decision Making  Medically screening exam initiated at 4:43 PM.  Appropriate orders placed.  Jrake Perfect was informed that the remainder of the evaluation will be completed by another provider, this initial triage assessment does not replace that evaluation, and the importance of remaining in the ED until their evaluation is complete.     Janeece Fitting, PA-C 06/28/22 1729

## 2022-06-28 NOTE — ED Triage Notes (Signed)
Diffuse abdominal pain for several days with nausea and vomiting. Denies fevers.

## 2022-06-29 LAB — RESP PANEL BY RT-PCR (FLU A&B, COVID) ARPGX2
Influenza A by PCR: NEGATIVE
Influenza B by PCR: NEGATIVE
SARS Coronavirus 2 by RT PCR: NEGATIVE

## 2022-06-29 MED ORDER — NITROFURANTOIN MONOHYD MACRO 100 MG PO CAPS: 100.0000 mg | ORAL_CAPSULE | Freq: Two times a day (BID) | ORAL | 0 refills | Status: DC

## 2022-06-29 MED ORDER — SUCRALFATE 1 G PO TABS: 1.0000 g | ORAL_TABLET | Freq: Three times a day (TID) | ORAL | 0 refills | Status: DC

## 2022-06-29 NOTE — ED Provider Notes (Signed)
[facility] EMERGENCY DEPARTMENT Provider Note   CSN: [id number] Arrival date & time: 06/28/22  1618     History  Chief Complaint  Patient presents with   Abdominal Pain    Dominic Hill is a 51 y.o. male.  HPI     51 year old male comes in with chief complaint of abdominal, nausea, bloating sensation and malaise for the last 5 days.  Patient has history of diabetes hypertension.  He states that his symptoms started with generalized abdominal pain.  Pain is worse with p.o. intake.  He initially had some diarrhea, but that has now resolved.  He also initially had vomiting, but now he only has nausea.  Patient does not have any appetite.  Review of systems negative for fevers, chills but patient has malaise.  A week ago he had some burning with urination.  No history of UTI.  States that he thinks that the burning has improved over time and mostly resolved now.  Patient works as a Administrator.  Indicates having some cough.  No history of PE, DVT.  Denies any sick contacts.  Home Medications Prior to Admission medications   Medication Sig Start Date End Date Taking? Authorizing Provider  nitrofurantoin, macrocrystal-monohydrate, (MACROBID) 100 MG capsule Take 1 capsule (100 mg total) by mouth 2 (two) times daily. 06/29/22  Yes Markes Shatswell, MD  sucralfate (CARAFATE) 1 g tablet Take 1 tablet (1 g total) by mouth 4 (four) times daily -  with meals and at bedtime. 06/29/22  Yes Varney Biles, MD  Continuous Blood Gluc Receiver (FREESTYLE LIBRE 14 DAY READER) [place] 1 each by Does not apply route every 14 (fourteen) days. 03/26/22   Maximiano Coss, NP  Continuous Blood Gluc Sensor (FREESTYLE LIBRE 14 DAY SENSOR) MISC 1 each by Does not apply route every 14 (fourteen) days. 03/26/22   Maximiano Coss, NP  dapagliflozin propanediol (FARXIGA) 10 MG TABS tablet Take 1 tablet (10 mg total) by mouth daily before breakfast. 03/26/22   Maximiano Coss, NP  Dulaglutide  (TRULICITY) 4.5 0000000 SOPN Inject 4.5 mg as directed once a week. 03/26/22   Maximiano Coss, NP  insulin detemir (LEVEMIR) 100 UNIT/ML FlexPen Inject 30 Units into the skin daily. 03/26/22   Maximiano Coss, NP  metFORMIN (GLUCOPHAGE) 1000 MG tablet Take 1 tablet (1,000 mg total) by mouth 2 (two) times daily with a meal. 03/26/22   Maximiano Coss, NP  omeprazole (PRILOSEC) 20 MG capsule Take 1 capsule (20 mg total) by mouth daily for 14 days. 02/05/21 02/19/21  Curatolo, Adam, DO  pravastatin (PRAVACHOL) 20 MG tablet Take 1 tablet (20 mg total) by mouth daily. 07/12/21   Maximiano Coss, NP      Allergies    Patient has no known allergies.    Review of Systems   Review of Systems  All other systems reviewed and are negative.   Physical Exam Updated Vital Signs BP (!) 156/101   Pulse 81   Temp 98.5 F (36.9 C)   Resp 14   SpO2 100%  Physical Exam Vitals and nursing note reviewed.  Constitutional:      Appearance: He is well-developed.  HENT:     Head: Atraumatic.  Cardiovascular:     Rate and Rhythm: Normal rate.  Pulmonary:     Effort: Pulmonary effort is normal.  Abdominal:     Tenderness: There is no abdominal tenderness.  Musculoskeletal:     Cervical back: Neck supple.  Skin:    General: Skin is  warm.  Neurological:     Mental Status: He is alert and oriented to person, place, and time.     ED Results / Procedures / Treatments   Labs (all labs ordered are listed, but only abnormal results are displayed) Labs Reviewed  COMPREHENSIVE METABOLIC PANEL - Abnormal; Notable for the following components:      Result Value   Glucose, Bld 105 (*)    AST 11 (*)    All other components within normal limits  RESP PANEL BY RT-PCR (FLU A&B, COVID) ARPGX2  CBC WITH DIFFERENTIAL/PLATELET  LIPASE, BLOOD  URINALYSIS, ROUTINE W REFLEX MICROSCOPIC    EKG None  Radiology CT ABDOMEN PELVIS W CONTRAST  Result Date: 06/28/2022 CLINICAL DATA:  Acute abdominal pain. EXAM: CT  ABDOMEN AND PELVIS WITH CONTRAST TECHNIQUE: Multidetector CT imaging of the abdomen and pelvis was performed using the standard protocol following bolus administration of intravenous contrast. RADIATION DOSE REDUCTION: This exam was performed according to the departmental dose-optimization program which includes automated exposure control, adjustment of the mA and/or kV according to patient size and/or use of iterative reconstruction technique. CONTRAST:  8mL OMNIPAQUE IOHEXOL 350 MG/ML SOLN COMPARISON:  None Available. FINDINGS: Lower chest: There is a 4 mm nodule in the right middle lobe. Hepatobiliary: No focal liver abnormality is seen. No gallstones, gallbladder wall thickening, or biliary dilatation. Pancreas: Unremarkable. No pancreatic ductal dilatation or surrounding inflammatory changes. Spleen: Normal in size without focal abnormality. Adrenals/Urinary Tract: Adrenal glands are unremarkable. Kidneys are normal, without renal calculi, focal lesion, or hydronephrosis. There is mild diffuse bladder wall thickening. Stomach/Bowel: Stomach is within normal limits. Appendix appears normal. No evidence of bowel wall thickening, distention, or inflammatory changes. Vascular/Lymphatic: Aortic atherosclerosis. No enlarged abdominal or pelvic lymph nodes. Reproductive: Prostate is unremarkable. Other: Small fat containing umbilical hernia.  No ascites. Musculoskeletal: No acute or significant osseous findings. IMPRESSION: 1. Mild diffuse bladder wall thickening, nonspecific. Correlate clinically for cystitis. 2. 4 mm right solid pulmonary nodule. Per Fleischner Society Guidelines, no routine follow-up imaging is recommended. These guidelines do not apply to immunocompromised patients and patients with cancer. Follow up in patients with significant comorbidities as clinically warranted. For lung cancer screening, adhere to Lung-RADS guidelines. Reference: Radiology. 2017; 284(1):228-43. Aortic Atherosclerosis  (ICD10-I70.0). Electronically Signed   By: Ronney Asters M.D.   On: 06/28/2022 23:05    Procedures Procedures    Medications Ordered in ED Medications  iohexol (OMNIPAQUE) 350 MG/ML injection 75 mL (75 mLs Intravenous Contrast Given 06/28/22 2234)    ED Course/ Medical Decision Making/ A&P                           Medical Decision Making Risk Prescription drug management.   This patient presents to the ED with chief complaint(s) of abd discomfort, bloating, nausea, poor appetite and malaise with episodes of burning with urination early in the course of his disease process with pertinent past medical history of diabetes and hypertension.The complaint involves an extensive differential diagnosis and also carries with it a high risk of complications and morbidity.    The differential diagnosis includes : Acute cystitis, COVID-19/flu, viral URI, gastroenteritis, ileus, gastroparesis, medication side effects.  Patient had basic blood work-up, CT abdomen pelvis.  CT scan shows signs of chronic cystitis.  Given that he mentions some burning with urination earlier in the course, I will give him antibiotics.  We will treat this as simple UTI however.  Other possibility for  symptomology includes medication side effect, gastroparesis and URI.  COVID-19 test sent.  Advised that patient follows up with medical doctor/PCP in 1 to 2 weeks if the symptoms persist.  We will also start patient on PPI and Carafate.   Additional history obtained: Records reviewed Primary Care Documents -reviewed patient's medications.  He is on Trulicity, [place] which could cause some of the GI symptoms.  Unfortunately, it does not appear that they are brand-new. Still our plan for follow-up with PCP is valid.  Patient agrees. Additionally, patient states that he started taking over-the-counter antacids recently and feels a lot better.  Advised that he just keeps taking that for now until seen by  PCP.  Independent labs interpretation:  The following labs were independently interpreted: CBC, BMP is normal.  UA actually looks completely fine, but he had some burning with urination therefore we will clinically treat this as a UTI.  Independent visualization and interpretation of imaging: - I independently visualized the following imaging with scope of interpretation limited to determining acute life threatening conditions related to emergency care: CT abdomen pelvis, which revealed no evidence of obstruction.    Final Clinical Impression(s) / ED Diagnoses Final diagnoses:  Cystitis  Nausea  Generalized abdominal pain    Rx / DC Orders ED Discharge Orders          Ordered    nitrofurantoin, macrocrystal-monohydrate, (MACROBID) 100 MG capsule  2 times daily        06/29/22 1154    sucralfate (CARAFATE) 1 g tablet  3 times daily with meals & bedtime        06/29/22 1154              Varney Biles, MD 06/29/22 1208

## 2022-06-29 NOTE — Discharge Instructions (Addendum)
You are seen in the emergency room for abdominal pain, nausea, bloating.  It appears she was clinically that you might have a mild bladder infection.  Take the antibiotics that are prescribed.  Other possibility includes medication side effects, specifically side effects from metformin or [place] and Trulicity.  If your symptoms persist despite the antibiotics, then we recommend seeing a primary care doctor to see if these medications could be causing your symptoms.

## 2022-12-12 ENCOUNTER — Ambulatory Visit: Payer: Commercial Managed Care - HMO | Admitting: Family Medicine

## 2022-12-12 DIAGNOSIS — I10 Essential (primary) hypertension: Secondary | ICD-10-CM

## 2022-12-12 DIAGNOSIS — Z7689 Persons encountering health services in other specified circumstances: Secondary | ICD-10-CM

## 2022-12-12 DIAGNOSIS — E1110 Type 2 diabetes mellitus with ketoacidosis without coma: Secondary | ICD-10-CM

## 2022-12-19 ENCOUNTER — Encounter: Payer: Self-pay | Admitting: Family Medicine

## 2022-12-19 ENCOUNTER — Ambulatory Visit (INDEPENDENT_AMBULATORY_CARE_PROVIDER_SITE_OTHER): Payer: Commercial Managed Care - HMO | Admitting: Family Medicine

## 2022-12-19 VITALS — BP 132/84 | HR 86 | Temp 99.4°F | Ht 72.0 in | Wt 166.1 lb

## 2022-12-19 DIAGNOSIS — Z1211 Encounter for screening for malignant neoplasm of colon: Secondary | ICD-10-CM

## 2022-12-19 DIAGNOSIS — Z1159 Encounter for screening for other viral diseases: Secondary | ICD-10-CM

## 2022-12-19 DIAGNOSIS — E1110 Type 2 diabetes mellitus with ketoacidosis without coma: Secondary | ICD-10-CM

## 2022-12-19 DIAGNOSIS — E785 Hyperlipidemia, unspecified: Secondary | ICD-10-CM | POA: Diagnosis not present

## 2022-12-19 DIAGNOSIS — I10 Essential (primary) hypertension: Secondary | ICD-10-CM

## 2022-12-19 DIAGNOSIS — Z114 Encounter for screening for human immunodeficiency virus [HIV]: Secondary | ICD-10-CM

## 2022-12-19 DIAGNOSIS — Z7689 Persons encountering health services in other specified circumstances: Secondary | ICD-10-CM

## 2022-12-19 DIAGNOSIS — Z1329 Encounter for screening for other suspected endocrine disorder: Secondary | ICD-10-CM

## 2022-12-19 LAB — CBC WITH DIFFERENTIAL/PLATELET
Basophils Absolute: 0.1 10*3/uL (ref 0.0–0.1)
Basophils Relative: 1.2 % (ref 0.0–3.0)
Eosinophils Absolute: 0.1 10*3/uL (ref 0.0–0.7)
Eosinophils Relative: 1.4 % (ref 0.0–5.0)
HCT: 41 % (ref 39.0–52.0)
Hemoglobin: 13.8 g/dL (ref 13.0–17.0)
Lymphocytes Relative: 30.4 % (ref 12.0–46.0)
Lymphs Abs: 1.8 10*3/uL (ref 0.7–4.0)
MCHC: 33.6 g/dL (ref 30.0–36.0)
MCV: 82.6 fl (ref 78.0–100.0)
Monocytes Absolute: 0.5 10*3/uL (ref 0.1–1.0)
Monocytes Relative: 7.9 % (ref 3.0–12.0)
Neutro Abs: 3.5 10*3/uL (ref 1.4–7.7)
Neutrophils Relative %: 59.1 % (ref 43.0–77.0)
Platelets: 171 10*3/uL (ref 150.0–400.0)
RBC: 4.96 Mil/uL (ref 4.22–5.81)
RDW: 13.3 % (ref 11.5–15.5)
WBC: 5.9 10*3/uL (ref 4.0–10.5)

## 2022-12-19 LAB — HEMOGLOBIN A1C: Hgb A1c MFr Bld: 15 % — ABNORMAL HIGH (ref 4.6–6.5)

## 2022-12-19 LAB — COMPREHENSIVE METABOLIC PANEL
ALT: 10 U/L (ref 0–53)
AST: 14 U/L (ref 0–37)
Albumin: 4.5 g/dL (ref 3.5–5.2)
Alkaline Phosphatase: 96 U/L (ref 39–117)
BUN: 15 mg/dL (ref 6–23)
CO2: 25 mEq/L (ref 19–32)
Calcium: 9.7 mg/dL (ref 8.4–10.5)
Chloride: 97 mEq/L (ref 96–112)
Creatinine, Ser: 1.06 mg/dL (ref 0.40–1.50)
GFR: 81.95 mL/min (ref >60.00)
Glucose, Bld: 416 mg/dL — ABNORMAL HIGH (ref 70–99)
Potassium: 4.8 mEq/L (ref 3.5–5.1)
Sodium: 132 mEq/L — ABNORMAL LOW (ref 135–145)
Total Bilirubin: 0.8 mg/dL (ref 0.2–1.2)
Total Protein: 7.7 g/dL (ref 6.0–8.3)

## 2022-12-19 LAB — TSH: TSH: 0.94 u[IU]/mL (ref 0.35–5.50)

## 2022-12-19 MED ORDER — PRAVASTATIN SODIUM 20 MG PO TABS: 20.0000 mg | ORAL_TABLET | Freq: Every day | ORAL | 3 refills | Status: AC

## 2022-12-19 MED ORDER — LOSARTAN POTASSIUM 25 MG PO TABS: 12.5000 mg | ORAL_TABLET | Freq: Every day | ORAL | 0 refills | Status: AC

## 2022-12-19 NOTE — Progress Notes (Signed)
New Patient Office Visit  Subjective    Patient ID: Dominic Hill, male    DOB: 1971-04-14  Age: 52 y.o. MRN: [id number]  CC:  Chief Complaint  Patient presents with   Diabetes   Establish Care    Pt is here today to Est Care. Pt would like labs done today. Pt is not FASTING.    HPI Dominic Hill presents to establish care with new provider.   Previous primary care provider was Janeece Agee, NP. Last visit was 03/26/2022.  Specialist: None  Diabetes: Chronic. Patient was prescribed Farxiga 10mg  daily, Trulciity 4.5mg /injection, Levemir. 30U a day and Metformin 1000mg  BID. Patient is only taking the Metformin. He reports he stopped the other medications when they were completed. He thought they were temporary medication. He monitors his blood sugar at home, sometimes once a day. He reports his readings are around 180-240. He reports he has changed his diet. He has decreased his carbohydrates like rice, and eats more vegetables. He eats a little fruit. He mostly grills, bakes, or puts chicken in a stew. Exercise: 3 days a week at the gym, averaging 1-2 hours.  Lab Results  Component Value Date   HGBA1C 15.0 (A) 03/26/2022    Hyperlipidemia: Chronic. Patient was prescribed Pravastatin 20mg  daily. He has not been taking medication. He denies having any complications with taking medication.  Lab Results  Component Value Date   CHOL 167 06/22/2020   HDL 41 06/22/2020   LDLCALC 79 06/22/2020   LDLDIRECT 64.7 03/10/2020   TRIG 285 (H) 06/22/2020   CHOLHDL 4.1 06/22/2020    HTN: Chronic. He reports he took medication many years ago, before he was even a diabetic. He reports he monitors his blood pressure at home, he reports his blood pressure has been around 120-130/70-80s. Denies chest pain, SHOB, headaches, dizziness, lightheadedness, and lower extremity edema.  BP Readings from Last 3 Encounters:  12/19/22 132/84  06/29/22 (!) 145/90  03/26/22 130/82    Outpatient Encounter  Medications as of 12/19/2022  Medication Sig   losartan (COZAAR) 25 MG tablet Take 0.5 tablets (12.5 mg total) by mouth daily.   metFORMIN (GLUCOPHAGE) 1000 MG tablet Take 1 tablet (1,000 mg total) by mouth 2 (two) times daily with a meal.   vitamin B-12 (CYANOCOBALAMIN) 100 MCG tablet Take 100 mcg by mouth daily.   [DISCONTINUED] Ascorbic Acid (VITAMIN C) 500 MG CAPS Take by mouth.   [DISCONTINUED] Continuous Blood Gluc Receiver (FREESTYLE LIBRE 14 DAY READER) DEVI 1 each by Does not apply route every 14 (fourteen) days.   [DISCONTINUED] Continuous Blood Gluc Sensor (FREESTYLE LIBRE 14 DAY SENSOR) MISC 1 each by Does not apply route every 14 (fourteen) days.   pravastatin (PRAVACHOL) 20 MG tablet Take 1 tablet (20 mg total) by mouth daily.   [DISCONTINUED] clomiPHENE (CLOMID) 50 MG tablet  (Patient not taking: Reported on 12/19/2022)   [DISCONTINUED] dapagliflozin propanediol (FARXIGA) 10 MG TABS tablet Take 1 tablet (10 mg total) by mouth daily before breakfast. (Patient not taking: Reported on 12/19/2022)   [DISCONTINUED] Dulaglutide (TRULICITY) 4.5 MG/0.5ML SOPN Inject 4.5 mg as directed once a week. (Patient not taking: Reported on 12/19/2022)   [DISCONTINUED] insulin detemir (LEVEMIR) 100 UNIT/ML FlexPen Inject 30 Units into the skin daily. (Patient not taking: Reported on 12/19/2022)   [DISCONTINUED] nitrofurantoin, macrocrystal-monohydrate, (MACROBID) 100 MG capsule Take 1 capsule (100 mg total) by mouth 2 (two) times daily. (Patient not taking: Reported on 12/19/2022)   [DISCONTINUED] omeprazole (PRILOSEC) 20 MG capsule  Take 1 capsule (20 mg total) by mouth daily for 14 days.   [DISCONTINUED] pravastatin (PRAVACHOL) 20 MG tablet Take 1 tablet (20 mg total) by mouth daily. (Patient not taking: Reported on 12/19/2022)   [DISCONTINUED] sucralfate (CARAFATE) 1 g tablet Take 1 tablet (1 g total) by mouth 4 (four) times daily -  with meals and at bedtime. (Patient not taking: Reported on 12/19/2022)    No facility-administered encounter medications on file as of 12/19/2022.    Past Medical History:  Diagnosis Date   Diabetes mellitus without complication    Hyperlipidemia    Hypertension     History reviewed. No pertinent surgical history.  Family History  Problem Relation Age of Onset   Hypertension Sister     Social History   Socioeconomic History   Marital status: Married    Spouse name: Not on file   Number of children: 2   Years of education: Not on file   Highest education level: Some college, no degree  Occupational History   Occupation: truck Hospital doctor  Tobacco Use   Smoking status: Never   Smokeless tobacco: Never  Vaping Use   Vaping Use: Never used  Substance and Sexual Activity   Alcohol use: No    Alcohol/week: 0.0 standard drinks of alcohol   Drug use: No   Sexual activity: Yes    Partners: Female  Other Topics Concern   Not on file  Social History Narrative   Lives with wife,   From [place] - came to [place] in 2000   Social Determinants of Health   Financial Resource Strain: Low Risk  (12/19/2022)   Overall Financial Resource Strain (CARDIA)    Difficulty of Paying Living Expenses: Not hard at all  Food Insecurity: No Food Insecurity (12/19/2022)   Hunger Vital Sign    Worried About Running Out of Food in the Last Year: Never true    Ran Out of Food in the Last Year: Never true  Transportation Needs: No Transportation Needs (12/19/2022)   PRAPARE - Administrator, Civil Service (Medical): No    Lack of Transportation (Non-Medical): No  Physical Activity: Sufficiently Active (12/19/2022)   Exercise Vital Sign    Days of Exercise per Week: 3 days    Minutes of Exercise per Session: 90 min  Stress: No Stress Concern Present (12/19/2022)   [employer] of Occupational Health - Occupational Stress Questionnaire    Feeling of Stress : Not at all  Social Connections: Moderately Integrated (12/19/2022)   Social Connection and Isolation  Panel [NHANES]    Frequency of Communication with Friends and Family: More than three times a week    Frequency of Social Gatherings with Friends and Family: Once a week    Attends Religious Services: More than 4 times per year    Active Member of [employer] or Organizations: No    Attends Banker Meetings: Never    Marital Status: Married  Catering manager Violence: Not At Risk (12/19/2022)   Humiliation, Afraid, Rape, and Kick questionnaire    Fear of Current or Ex-Partner: No    Emotionally Abused: No    Physically Abused: No    Sexually Abused: No    ROS See HPI above    Objective   BP 132/84   Pulse 86   Temp 99.4 F (37.4 C)   Ht 6' (1.829 m)   Wt 166 lb 2 oz (75.4 kg)   SpO2 99%   BMI 22.53 kg/m  Physical Exam Vitals reviewed.  Constitutional:      General: He is not in acute distress.    Appearance: Normal appearance. He is not ill-appearing, toxic-appearing or diaphoretic.  HENT:     Head: Normocephalic and atraumatic.  Eyes:     General:        Right eye: No discharge.        Left eye: No discharge.     Conjunctiva/sclera: Conjunctivae normal.  Cardiovascular:     Rate and Rhythm: Normal rate and regular rhythm.     Pulses:          Dorsalis pedis pulses are 3+ on the right side and 3+ on the left side.     Heart sounds: Normal heart sounds. No murmur heard.    No friction rub. No gallop.  Pulmonary:     Effort: Pulmonary effort is normal. No respiratory distress.     Breath sounds: Normal breath sounds.  Musculoskeletal:        General: Normal range of motion.     Right foot: Normal range of motion. No deformity.     Left foot: Normal range of motion. No deformity.  Feet:     Right foot:     Protective Sensation: 10 sites tested.  10 sites sensed.     Skin integrity: Skin integrity normal.     Toenail Condition: Right toenails are normal.     Left foot:     Protective Sensation: 10 sites tested.  10 sites sensed.     Skin integrity: Skin  integrity normal.     Toenail Condition: Left toenails are normal.  Skin:    General: Skin is warm and dry.  Neurological:     General: No focal deficit present.     Mental Status: He is alert and oriented to person, place, and time. Mental status is at baseline.  Psychiatric:        Mood and Affect: Mood normal.        Behavior: Behavior normal.        Thought Content: Thought content normal.        Judgment: Judgment normal.      Assessment & Plan:  DM (diabetes mellitus) type 2, uncontrolled, with ketoacidosis Assessment & Plan: Continue with Metformin  BID. He has discontinued Farxiga, Trulicity Injections and Levemir. Last A1c was 15 in 03/2022. Ordered an A1c to see where he stands on diabetes management. Will need to start another diabetic medication if his A1c is elevated.Also, ordered microalbumin/creatinine urine ratio, CBC, and CMP.  Foot exam completed today. Prescribed Losartan 12.5mg , 1 tablet at bedtime for kidney protection with being diabetic. Discussed this medication is not a temporary medication, he is took it as long as he is taking medication for diabetes.  Recommend patient to bring in his blood sugar readings at his 2 week follow up.   Orders: -     HM Diabetes Foot Exam -     Microalbumin / creatinine urine ratio -     Hemoglobin A1c -     Comprehensive metabolic panel -     CBC with Differential/Platelet -     Losartan Potassium; Take 0.5 tablets (12.5 mg total) by mouth daily.  Dispense: 45 tablet; Refill: 0  Primary hypertension Assessment & Plan: Blood pressure was initially elevated. On recheck it was stable. He reports use to take medication prior to being a diabetic. Based on review of medications, at one time he use to take Amlodipine  and HCTZ. Recommend patient monitor his blood pressure BID and bring to his next appointment in 2 weeks.    Hyperlipidemia, unspecified hyperlipidemia type Assessment & Plan: Prescribed Pravastatin , 1 tablet  at bedtime. He is not fasting today. Ask patient to be fasting at his 2 week appointment to obtain a lipid panel. Discussed this medication is not temporary, it is to take as long as his diabetes is being treated.   Orders: -     Pravastatin Sodium; Take 1 tablet (20 mg total) by mouth daily.  Dispense: 90 tablet; Refill: 3  Colon cancer screening -     Ambulatory referral to Gastroenterology  Encounter for hepatitis C screening test for low risk patient -     Hepatitis C antibody  Screening for thyroid disorder -     TSH  Encounter for screening for HIV -     HIV Antibody (routine testing w rflx)  Encounter to establish care   1.Review health maintenance: -Ordered Hep C and HIV screening -Tonya, CMA send a message for patient to have ophthalmology exam in office by [facility].  -Declines covid booster -Declines shingles vaccine -Placed a referral to GI for colonoscopy 2.Ordered TSH for screening. Last TSH was normal on 10/14/2019. Return in about 2 weeks (around 01/02/2023) for follow-up-needs to be fasting.   Zandra Abts, NP

## 2022-12-19 NOTE — Assessment & Plan Note (Addendum)
Continue with Metformin  BID. He has discontinued Farxiga, Trulicity Injections and Levemir. Last A1c was 15 in 03/2022. Ordered an A1c to see where he stands on diabetes management. Will need to start another diabetic medication if his A1c is elevated.Also, ordered microalbumin/creatinine urine ratio, CBC, and CMP.  Foot exam completed today. Prescribed Losartan 12.5mg , 1 tablet at bedtime for kidney protection with being diabetic. Discussed this medication is not a temporary medication, he is took it as long as he is taking medication for diabetes.  Recommend patient to bring in his blood sugar readings at his 2 week follow up.

## 2022-12-19 NOTE — Assessment & Plan Note (Signed)
Prescribed Pravastatin , 1 tablet at bedtime. He is not fasting today. Ask patient to be fasting at his 2 week appointment to obtain a lipid panel. Discussed this medication is not temporary, it is to take as long as his diabetes is being treated.

## 2022-12-19 NOTE — Patient Instructions (Addendum)
-  It was a pleasure to meet you today and I look forward to taking care of you.  -Ordered labs today to see where you are for diabetes, kidney function, liver function, and screening. Office will call with lab results and you may see results in MyChart. Also, checking your urine today to see how your kidneys are doing.  -Continue taking Metformin  twice a day.  -START back on Pravastatin , 1 tablet at bedtime. This is not a temporary medication. It is suppose to taken for a lifetime as long as you are being treated for diabetes.  -START Lovastatin 12.5mg  (half of a  tablet), 1 tablet at bedtime. Right now, this is more for kidney protection of having diabetes. This is not a temporary medication. It is suppose to taken for a lifetime as long as you are being treated for diabetes.  -Recommend to take your blood pressure twice a day, once in the morning and once in the evening. Bring in your readings at your next appointment.  -Recommend to take your blood sugars at home and bring to your next appointment. -Placed an referral to GI for colonoscopy. If you do not hear about an appointment in 2 weeks, please call back to the office.  -Follow up in 2 weeks. Please be fasting at your next appointment. That means only water or black coffee 6 hours before your visit.

## 2022-12-19 NOTE — Assessment & Plan Note (Addendum)
Blood pressure was initially elevated. On recheck it was stable. He reports use to take medication prior to being a diabetic. Based on review of medications, at one time he use to take Amlodipine and HCTZ. Recommend patient monitor his blood pressure BID and bring to his next appointment in 2 weeks.

## 2022-12-20 LAB — HEPATITIS C ANTIBODY: Hepatitis C Ab: NONREACTIVE

## 2022-12-20 LAB — HIV ANTIBODY (ROUTINE TESTING W REFLEX): HIV 1&2 Ab, 4th Generation: NONREACTIVE

## 2022-12-22 ENCOUNTER — Telehealth: Payer: Self-pay | Admitting: Family Medicine

## 2022-12-22 ENCOUNTER — Other Ambulatory Visit: Payer: Self-pay

## 2022-12-22 DIAGNOSIS — E1110 Type 2 diabetes mellitus with ketoacidosis without coma: Secondary | ICD-10-CM

## 2022-12-22 MED ORDER — TIRZEPATIDE 2.5 MG/0.5ML ~~LOC~~ SOAJ: 0.2500 mg | SUBCUTANEOUS | 0 refills | Status: DC

## 2023-01-02 ENCOUNTER — Ambulatory Visit: Payer: Commercial Managed Care - HMO | Admitting: Family Medicine

## 2023-01-02 ENCOUNTER — Other Ambulatory Visit: Payer: Commercial Managed Care - HMO

## 2023-01-05 NOTE — Progress Notes (Unsigned)
   Established Patient Office Visit   Subjective:  Patient ID: Dominic Hill, male    DOB: [birth year]  Age: 52 y.o. MRN: [id number]  No chief complaint on file.   HPI Diabetes: Chronic. Patients A1c is 15 on 04/19. At previous appointment he was taking Metformin 2000 mg daily. He was previously on [place] 10mg  daily, Trulcity 4.5mg /injection, and Levemire 30U a day. When his A1c came back elevated, recommend Mounjaro 2.5mg /injection every week.   Hyperlipidemia:Chronic. On previous visit, restarted Pravastatin 20mg  at bedtime because he stopped taking it. He was not fasting and asked patient to be fasting for next visit.   HTN: Chronic. On previous visit, requested patient to monitor his blood pressure BID and bring to his next appointment because initially he was elevated. Prescribed Losartan 12.5mg  daily for kidney protection with being diabetic.   ROS See HPI above     Objective:     There were no vitals taken for this visit. {Vitals History (Optional):23777}  Physical Exam  No results found for any visits on 01/06/23.  The 10-year ASCVD risk score (Arnett DK, et al., 2019) is: 17%    Assessment & Plan:  There are no diagnoses linked to this encounter.  No follow-ups on file.   Zandra Abts, NP

## 2023-01-06 ENCOUNTER — Ambulatory Visit (INDEPENDENT_AMBULATORY_CARE_PROVIDER_SITE_OTHER): Payer: Commercial Managed Care - HMO | Admitting: Family Medicine

## 2023-01-06 ENCOUNTER — Telehealth: Payer: Self-pay

## 2023-01-06 ENCOUNTER — Encounter: Payer: Self-pay | Admitting: Family Medicine

## 2023-01-06 VITALS — BP 120/70 | HR 86 | Temp 98.7°F | Wt 171.1 lb

## 2023-01-06 DIAGNOSIS — E1110 Type 2 diabetes mellitus with ketoacidosis without coma: Secondary | ICD-10-CM | POA: Diagnosis not present

## 2023-01-06 DIAGNOSIS — I10 Essential (primary) hypertension: Secondary | ICD-10-CM | POA: Diagnosis not present

## 2023-01-06 DIAGNOSIS — Z7984 Long term (current) use of oral hypoglycemic drugs: Secondary | ICD-10-CM

## 2023-01-06 DIAGNOSIS — Z1211 Encounter for screening for malignant neoplasm of colon: Secondary | ICD-10-CM

## 2023-01-06 DIAGNOSIS — E785 Hyperlipidemia, unspecified: Secondary | ICD-10-CM

## 2023-01-06 LAB — LIPID PANEL
Cholesterol: 161 mg/dL (ref 0–200)
HDL: 44.9 mg/dL (ref >39.00)
LDL Cholesterol: 83 mg/dL (ref 0–99)
NonHDL: 115.77
Total CHOL/HDL Ratio: 4
Triglycerides: 165 mg/dL — ABNORMAL HIGH (ref 0.0–149.0)
VLDL: 33 mg/dL (ref 0.0–40.0)

## 2023-01-06 LAB — MICROALBUMIN / CREATININE URINE RATIO
Creatinine,U: 73.8 mg/dL
Microalb Creat Ratio: 0.9 mg/g (ref 0.0–30.0)
Microalb, Ur: <0.7 mg/dL (ref 0.0–1.9)

## 2023-01-06 LAB — BASIC METABOLIC PANEL
BUN: 12 mg/dL (ref 6–23)
CO2: 30 mEq/L (ref 19–32)
Calcium: 9.6 mg/dL (ref 8.4–10.5)
Chloride: 99 mEq/L (ref 96–112)
Creatinine, Ser: 0.89 mg/dL (ref 0.40–1.50)
GFR: 100.04 mL/min (ref >60.00)
Glucose, Bld: 227 mg/dL — ABNORMAL HIGH (ref 70–99)
Potassium: 4 mEq/L (ref 3.5–5.1)
Sodium: 136 mEq/L (ref 135–145)

## 2023-01-06 MED ORDER — DAPAGLIFLOZIN PROPANEDIOL 10 MG PO TABS: 10.0000 mg | ORAL_TABLET | Freq: Every day | ORAL | 0 refills | Status: AC

## 2023-01-06 NOTE — Telephone Encounter (Signed)
-----   Message from Alveria Apley, NP sent at 01/06/2023  2:35 PM EDT ----- Your triglycerides are elevated, Diet encouraged - increase intake of fresh fruits and vegetables, increase intake of lean proteins. Bake, broil, or grill foods. Avoid fried, greasy, and fatty foods. Avoid fast foods. Increase intake of fiber-rich whole grains. Exercise encouraged - at least 150 minutes per week and advance as tolerated. Continue medication Pravastatin.  Sodium has improved. Microalbumin Creat Ratio is normal.

## 2023-01-06 NOTE — Patient Instructions (Addendum)
-  Continue taking Metformin 1000mg  twice a day. We had prescribed Mounjaro 2.5mg  weekly injections, but you were not able to pick up. We are trying to find out about the prior authorization of this medication. We will be back in touch with you about this. In the meantime, Marcelline Deist has been prescribed, 10mg  tablet, 1 tablet a day. If you are not able to get medication, please call back to the office. We need to get both of these medications started about 2 weeks apart to get your diabetes more under control. -Discussed about how Marcelline Deist works and the importance of hydration and genital cleanness with preventing yeast infections and urinary tract infections.   -Please come to your eye appointment on Friday. -Ordered urine today to check your kidneys. Office will call with lab results and you may see them in MyChart.  -Ordered labs to check your sodium level again and your lipids for your cholesterol. Office will call with lab results and you may see them in MyChart. -Continue with all your prescribed medication. -Placed a referral to GI for a colonoscopy. If you do not hear back about an appointment in 2 weeks, please call back the office.  -Follow up in 1 month.

## 2023-01-06 NOTE — Assessment & Plan Note (Signed)
Continue with Losartan 12.5mg  daily.

## 2023-01-06 NOTE — Assessment & Plan Note (Signed)
Continue with Pravastatin 20mg  at bedtime. Ordered lipid panel today since he is fasting.

## 2023-01-06 NOTE — Telephone Encounter (Signed)
Pt will need PA for [facility]

## 2023-01-06 NOTE — Assessment & Plan Note (Signed)
Continue taking Metformin 1000mg  twice a day. Mounjaro 2.5mg  weekly injections was prescribed, but was not able it pick up due to a prior authorization.Dominic Hill, CMA has sent a message to the PA department to find out about this situation. In the meantime, Dominic Hill has been prescribed, 10mg  tablet, 1 tablet a day. He has took this medication previously and reported he had no complications with taking it. Advised if he was not able to get medication, please call back to the office. We need to get both of these medications started about 2 weeks apart to get his diabetes more under control. -Discussed about how Dominic Hill works and the importance of hydration and genital cleanness with preventing yeast infections and urinary tract infections.   -Ordered microalbumin/Creatinine Ratio.

## 2023-01-07 ENCOUNTER — Other Ambulatory Visit: Payer: Self-pay

## 2023-01-07 ENCOUNTER — Other Ambulatory Visit (HOSPITAL_COMMUNITY): Payer: Self-pay

## 2023-01-07 NOTE — Telephone Encounter (Signed)
Pt has been notified.

## 2023-01-08 ENCOUNTER — Telehealth: Payer: Self-pay

## 2023-01-08 NOTE — Telephone Encounter (Signed)
PA submitted. Created new encounter for PA. Will update and route back to pool once determination has been made.

## 2023-01-08 NOTE — Telephone Encounter (Signed)
Pharmacy Patient Advocate Encounter   Received notification that prior authorization for Mounjaro 2.5MG /0.5ML pen-injectors is required/requested.   PA submitted on 01/08/23 to (ins) Child psychotherapist via [employer] or (Medicaid) confirmation # [id number] Status is pending

## 2023-01-09 ENCOUNTER — Other Ambulatory Visit: Payer: Self-pay

## 2023-01-09 DIAGNOSIS — Z1211 Encounter for screening for malignant neoplasm of colon: Secondary | ICD-10-CM

## 2023-01-09 LAB — HM DIABETES EYE EXAM

## 2023-01-09 NOTE — Telephone Encounter (Signed)
Okay, Thank you!

## 2023-01-14 ENCOUNTER — Encounter: Payer: Self-pay | Admitting: Family Medicine

## 2023-01-16 NOTE — Telephone Encounter (Signed)
Pharmacy Patient Advocate Encounter  Received notification from [employer] that the request for prior authorization for Dominic Hill has been denied due to the information submitted did not meet the criteria necessary to approve this medication. There is nothing to support that the individual has contraindication, or is intolerant to one of the  following covered alternatives: A. Byetta/Bydureon [may require prior authorization]; and B. Trulicity  [may require prior authorization]. There is no indication that your patient has met both of the following: A) Individual will continue  maximally tolerated metformin therapy, if not contraindicated, intolerant, or otherwise not a  candidate; and B) Documentation of one of the following: 1. Unable to achieve goal HbA1C despite  metformin or metformin-containing regimen (meglitinides, sulfonylureas, or thiazolidinediones) at greater than or equal to 1,500 mg per day; 2. Intolerance to metformin 1,500 mg per day despite  appropriate dose titration duration (for example, period of 8-12 weeks); 3. Contraindication to metformin per FDA label (for example, acute/chronic metabolic acidosis, severe renal dysfunction); 4. Not a candidate for metformin (for example, hepatic impairment, moderate renal dysfunction,  unstable heart failure, individual is using an agent for a non-diabetic FDA-approved indication); 5. Initial metformin combination therapy is clinically appropriate for elevated HbA1C (for example, greater than 1.5% above goal); or 6. Initial metformin combination therapy is clinically appropriate  in an individual with co-morbid conditions (such as ASCVD, heart failure, or CKD). .    An appeal has been faxed to [employer] at [phone] with attached chart notes and labs.

## 2023-01-19 ENCOUNTER — Other Ambulatory Visit (HOSPITAL_COMMUNITY): Payer: Self-pay

## 2023-01-19 NOTE — Telephone Encounter (Signed)
Patient Advocate Encounter  The appeal for Dominic Hill has been approved with [employer].     Effective dates: 01/19/23 through 01/19/24

## 2023-01-19 NOTE — Telephone Encounter (Signed)
Thank you. I have contacted pt and pharmacy.

## 2023-01-22 ENCOUNTER — Telehealth: Payer: Self-pay | Admitting: Family Medicine

## 2023-01-22 NOTE — Telephone Encounter (Signed)
Caller name: Remi Enloe  On DPR?: Yes  Call back number: [phone] (home)  Provider they see: Alveria Apley, NP  Reason for call:   Pt called stating a nurse will be calling from [facility] and he wants her to have his last set of lab results. (p) [phone]

## 2023-01-28 ENCOUNTER — Telehealth: Payer: Self-pay

## 2023-01-28 ENCOUNTER — Telehealth: Payer: Self-pay | Admitting: Family Medicine

## 2023-01-28 NOTE — Telephone Encounter (Signed)
Caller name: Dameian Lozo  On DPR?: Yes  Call back number: [phone] (mobile)  Provider they see: Alveria Apley, NP  Reason for call:   Pt would like lab results faxed to [facility] they will be there this morning for an appt. Fax: [phone] Phone: [phone] Results form 01/06/2023 visit.

## 2023-01-28 NOTE — Telephone Encounter (Signed)
Pt a no show for PV with RN on 01/27/23. Colonoscopy has been cancelled. No show letter sent to patient mychart and home address.

## 2023-01-28 NOTE — Telephone Encounter (Signed)
This has been completed.

## 2023-02-04 NOTE — Progress Notes (Signed)
   Established Patient Office Visit   Subjective:  Patient ID: Dominic Hill, male    DOB: [birth year]  Age: 52 y.o. MRN: [id number]  Chief Complaint  Patient presents with   Follow-up    Pt is here today to F/U  Pt reports he is taking meds with no issues.    HPI Diabetes: Patients last A1c was 15 on 04/19, patient is taking Metformin 1000mg  BID,  Farxiga 10mg  daily, and Mounjaro 2.5mg  per injection. He reports he has had two Mounjaro injections. He reports he is managing his blood sugars 170-300. Denies abd pain, nausea, vomiting, urinary symptoms, or syeast symptoms.   Diet:Eating more fruits and vegetables. Decreased rice.  Exercise: Treadmill and bike, 3 times a week, 30-90 minutes.   ROS See HPI above     Objective:   BP 134/80   Pulse 75   Temp 98.5 F (36.9 C)   Ht 6' (1.829 m)   Wt 179 lb 6 oz (81.4 kg)   SpO2 98%   BMI 24.33 kg/m    Physical Exam Vitals reviewed.  Constitutional:      General: He is not in acute distress.    Appearance: Normal appearance. He is not ill-appearing, toxic-appearing or diaphoretic.  HENT:     Head: Normocephalic and atraumatic.  Eyes:     General:        Right eye: No discharge.        Left eye: No discharge.     Conjunctiva/sclera: Conjunctivae normal.  Cardiovascular:     Rate and Rhythm: Normal rate and regular rhythm.     Heart sounds: Normal heart sounds. No murmur heard.    No friction rub. No gallop.  Pulmonary:     Effort: Pulmonary effort is normal. No respiratory distress.     Breath sounds: Normal breath sounds.  Musculoskeletal:        General: Normal range of motion.  Skin:    General: Skin is warm and dry.  Neurological:     General: No focal deficit present.     Mental Status: He is alert and oriented to person, place, and time. Mental status is at baseline.  Psychiatric:        Mood and Affect: Mood normal.        Behavior: Behavior normal.        Thought Content: Thought content normal.         Judgment: Judgment normal.      Assessment & Plan:  1.Review health maintenance: -Patient states he has an appointment with GI today at 2:30. -Declines covid booster -Declines shingles vaccine.   Return in about 2 months (around 04/08/2023) for chronic management.   Zandra Abts, NP

## 2023-02-06 ENCOUNTER — Ambulatory Visit (INDEPENDENT_AMBULATORY_CARE_PROVIDER_SITE_OTHER): Payer: Commercial Managed Care - HMO | Admitting: Family Medicine

## 2023-02-06 ENCOUNTER — Encounter: Payer: Self-pay | Admitting: Family Medicine

## 2023-02-06 ENCOUNTER — Encounter: Payer: Commercial Managed Care - HMO | Admitting: Gastroenterology

## 2023-02-06 VITALS — BP 134/80 | HR 75 | Temp 98.5°F | Ht 72.0 in | Wt 179.4 lb

## 2023-02-06 DIAGNOSIS — Z7985 Long-term (current) use of injectable non-insulin antidiabetic drugs: Secondary | ICD-10-CM

## 2023-02-06 DIAGNOSIS — E1165 Type 2 diabetes mellitus with hyperglycemia: Secondary | ICD-10-CM | POA: Diagnosis not present

## 2023-02-06 DIAGNOSIS — E1110 Type 2 diabetes mellitus with ketoacidosis without coma: Secondary | ICD-10-CM | POA: Diagnosis not present

## 2023-02-06 DIAGNOSIS — Z7984 Long term (current) use of oral hypoglycemic drugs: Secondary | ICD-10-CM | POA: Diagnosis not present

## 2023-02-06 MED ORDER — TIRZEPATIDE 5 MG/0.5ML ~~LOC~~ SOAJ: 5.0000 mg | SUBCUTANEOUS | 1 refills | Status: AC

## 2023-02-06 NOTE — Assessment & Plan Note (Signed)
Continue with Metformin 1000mg  twice daily and Farxiga 10mg  daily. INCREASED Mounjaro to 5mg  per injection from 2.5mg  injection. Completely take the last two 2.5mg  Mounjaro injections before starting 5mg  dose. Make sure to hydrate well with taking Dominic Hill with it becoming hot outside.

## 2023-02-06 NOTE — Patient Instructions (Signed)
-  Continue with Metformin 1000mg  twice daily and Farxiga 10mg  daily. INCREASED Mounjaro to 5mg  per injection from 2.5mg  injection. Completely take the last two 2.5mg  Mounjaro injections before starting 5mg  dose.  -Make sure to hydrate well with taking Farxiga with it becoming hot outside.  -Follow up in 2 months and be fasting.

## 2023-02-20 LAB — COLOGUARD: COLOGUARD: NEGATIVE

## 2023-02-20 NOTE — Progress Notes (Signed)
Pt has been notified.

## 2023-02-26 ENCOUNTER — Other Ambulatory Visit (HOSPITAL_COMMUNITY): Payer: Self-pay

## 2023-02-27 ENCOUNTER — Ambulatory Visit (AMBULATORY_SURGERY_CENTER): Payer: Commercial Managed Care - HMO

## 2023-02-27 VITALS — Ht 72.0 in | Wt 175.0 lb

## 2023-02-27 DIAGNOSIS — Z1211 Encounter for screening for malignant neoplasm of colon: Secondary | ICD-10-CM

## 2023-02-27 MED ORDER — NA SULFATE-K SULFATE-MG SULF 17.5-3.13-1.6 GM/177ML PO SOLN
1.0000 | Freq: Once | ORAL | 0 refills | Status: AC
Start: 2023-02-27 — End: 2023-02-27

## 2023-02-27 NOTE — Progress Notes (Signed)
No egg allergy known to patient   No surgeries in the past  No FH of Malignant Hyperthermia Pt is not on diet pills  Pt is not on  home 02   Pt is not on blood thinners   Pt denies issues with constipation   No A fib or A flutter  Have any cardiac testing pending--no  Pt instructed to use [employer] or GoodRx for a price reduction on prep   Patient's chart reviewed by Cathlyn Parsons CRNA prior to previsit and patient appropriate for the [facility].  Previsit completed and red dot placed by patient's name on their procedure day (on provider's schedule).

## 2023-03-05 ENCOUNTER — Other Ambulatory Visit (HOSPITAL_COMMUNITY): Payer: Self-pay

## 2023-03-11 ENCOUNTER — Encounter: Payer: Self-pay | Admitting: Gastroenterology

## 2023-03-20 ENCOUNTER — Encounter: Payer: Commercial Managed Care - HMO | Admitting: Gastroenterology

## 2023-03-20 ENCOUNTER — Telehealth: Payer: Self-pay | Admitting: Gastroenterology

## 2023-03-20 NOTE — Telephone Encounter (Signed)
Good Afternoon Dr. Myrtie Neither,  I called this patient at 12:50 today and did not get anyone to answer.  I left a message that if he was running late or would like to reschedule to call us back.  I will NO SHOW patient.  [employer].

## 2023-03-31 ENCOUNTER — Telehealth: Payer: Self-pay | Admitting: Family Medicine

## 2023-03-31 NOTE — Telephone Encounter (Signed)
Caller name: Cabot Windhorst  On DPR?: Yes  Call back number: [phone] (mobile)  Provider they see: Alveria Apley, NP  Reason for call: Tried to call and reschedule his appt on 8/9. Pt didn't answer and voicemail was not set up to leave a message. Will try again tomorrow.

## 2023-04-10 ENCOUNTER — Ambulatory Visit: Payer: Commercial Managed Care - HMO | Admitting: Family Medicine

## 2023-12-02 ENCOUNTER — Telehealth: Payer: Self-pay | Admitting: *Deleted

## 2023-12-02 NOTE — Telephone Encounter (Signed)
 Patient was identified as falling into the [place] Measure - Diabetes.   Patient's telephone number is invalid.   A letter was sent via Mychart.

## 2024-01-27 ENCOUNTER — Telehealth: Payer: Self-pay | Admitting: *Deleted

## 2024-01-27 NOTE — Telephone Encounter (Signed)
 Patient was identified as falling into the [place] Measure - Diabetes.   Patient was: Requires a call back at a later time. Attempted to call the patient, but voicemail is not set up.
